# Patient Record
Sex: Female | Born: 1975
Health system: Southern US, Community
[De-identification: ages and names within clinical notes are randomized; demographics above are authoritative.]

## PROBLEM LIST (undated history)

## (undated) DIAGNOSIS — D500 Iron deficiency anemia secondary to blood loss (chronic): Secondary | ICD-10-CM

## (undated) DIAGNOSIS — T7840XA Allergy, unspecified, initial encounter: Secondary | ICD-10-CM

## (undated) DIAGNOSIS — D510 Vitamin B12 deficiency anemia due to intrinsic factor deficiency: Secondary | ICD-10-CM

## (undated) DIAGNOSIS — D250 Submucous leiomyoma of uterus: Secondary | ICD-10-CM

## (undated) DIAGNOSIS — N921 Excessive and frequent menstruation with irregular cycle: Secondary | ICD-10-CM

## (undated) HISTORY — DX: Vitamin B12 deficiency anemia due to intrinsic factor deficiency: D51.0

## (undated) HISTORY — PX: LAPAROSCOPIC DONOR NEPHRECTOMY: SUR763

## (undated) HISTORY — PX: ESOPHAGOGASTRODUODENOSCOPY (EGD) WITH PROPOFOL: SHX5813

## (undated) HISTORY — DX: Allergy, unspecified, initial encounter: T78.40XA

---

## 2001-10-01 ENCOUNTER — Other Ambulatory Visit: Admission: RE | Admit: 2001-10-01 | Discharge: 2001-10-01 | Payer: Self-pay | Admitting: Family Medicine

## 2009-05-13 HISTORY — PX: NEPHRECTOMY TRANSPLANTED ORGAN: SUR880

## 2009-05-25 ENCOUNTER — Encounter: Admission: RE | Admit: 2009-05-25 | Discharge: 2009-05-25 | Payer: Self-pay | Admitting: Family Medicine

## 2009-06-15 ENCOUNTER — Encounter: Admission: RE | Admit: 2009-06-15 | Discharge: 2009-06-15 | Payer: Self-pay | Admitting: Family Medicine

## 2010-04-12 DIAGNOSIS — IMO0002 Reserved for concepts with insufficient information to code with codable children: Secondary | ICD-10-CM

## 2010-04-12 HISTORY — DX: Reserved for concepts with insufficient information to code with codable children: IMO0002

## 2010-11-29 IMAGING — US US PELVIS COMPLETE
1 series · 13 of 25 positions shown · non-contrast
Comparison: None

CLINICAL DATA: Heavy menses with vaginal bleeding with pain and
clots for 3 months.  LMP 05/04/2009

TRANSABDOMINAL AND TRANSVAGINAL ULTRASOUND OF PELVIS
TECHNIQUE: Both transabdominal and transvaginal ultrasound
examinations of the pelvis were performed including evaluation of
the uterus, ovaries, adnexal regions, and pelvic cul-de-sac.

[Series 1: us pelvis complete · 0.26mm/px · 13 of 35 slices shown]
[im 1/35]
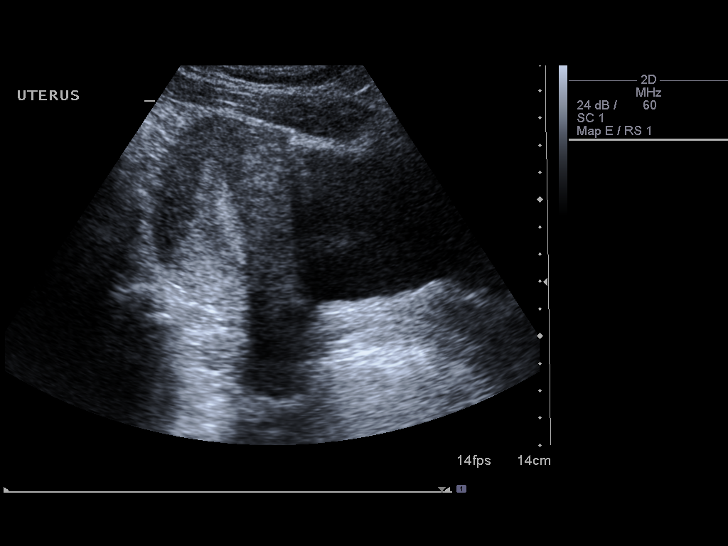
[im 3/35]
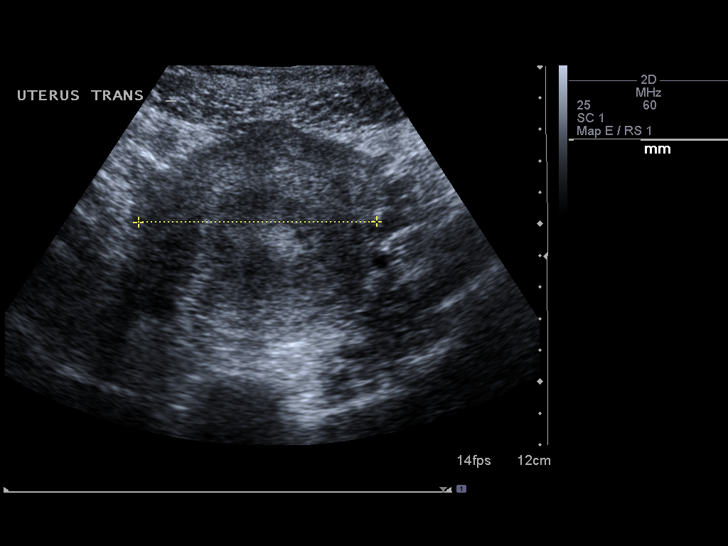
[im 6/35]
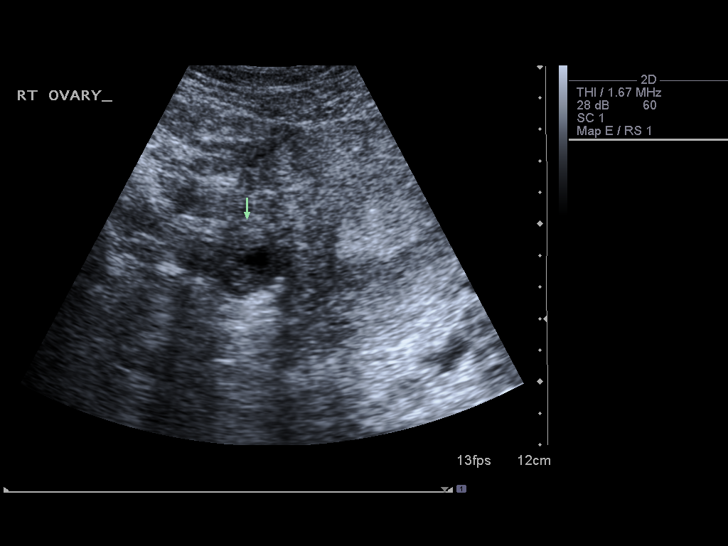
[im 9/35]
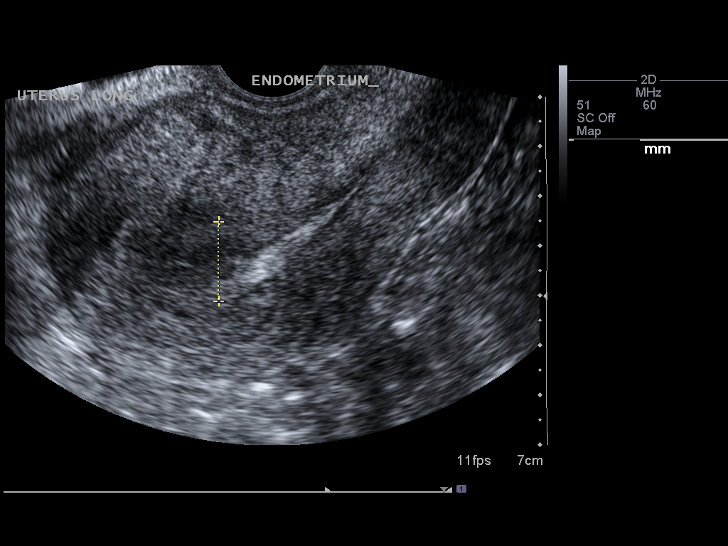
[im 12/35]
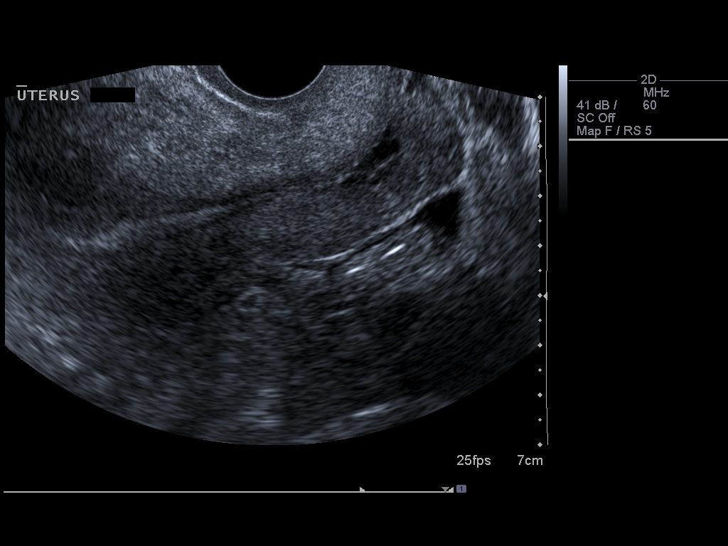
[im 15/35]
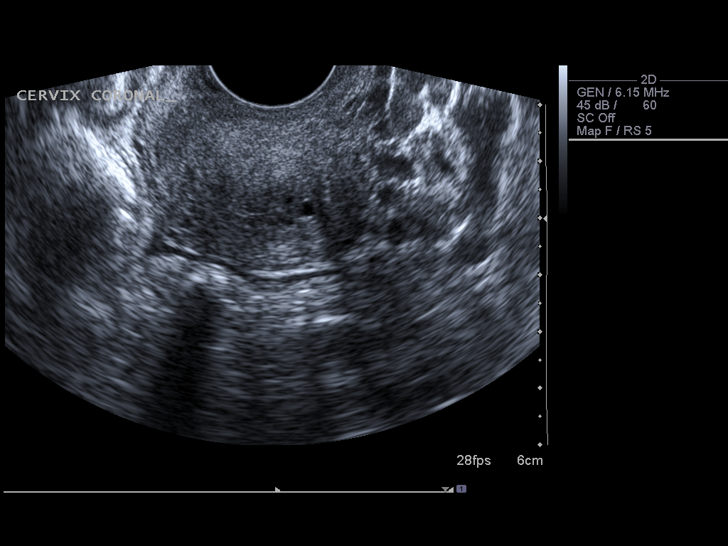
[im 18/35]
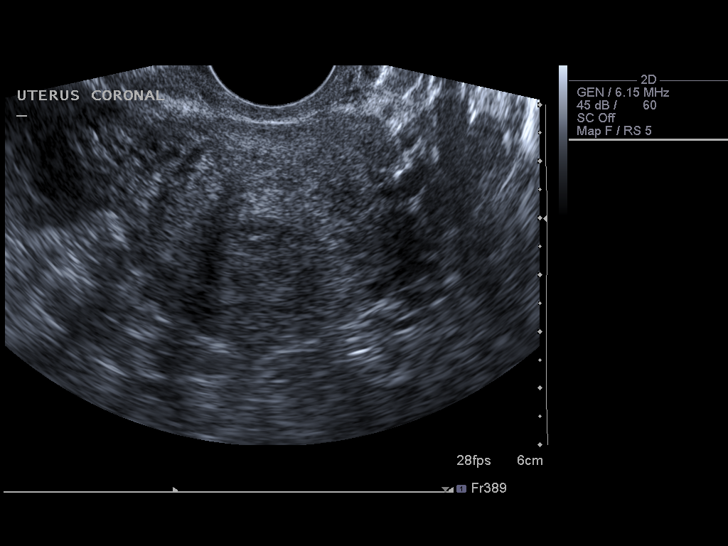
[im 20/35]
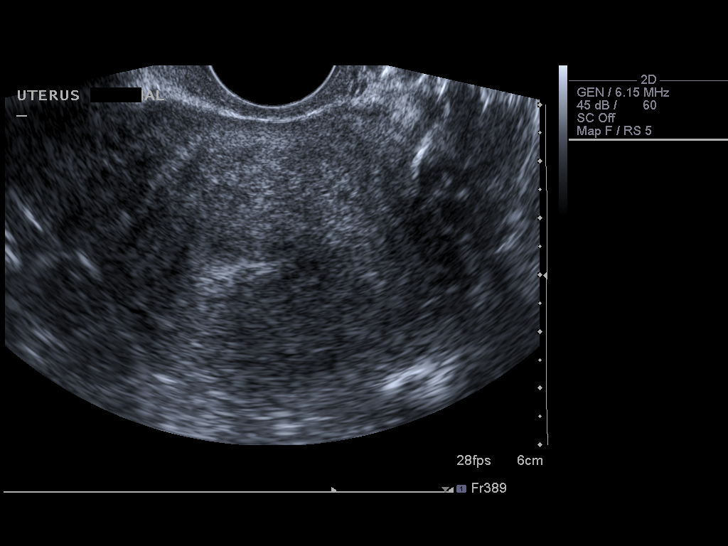
[im 23/35]
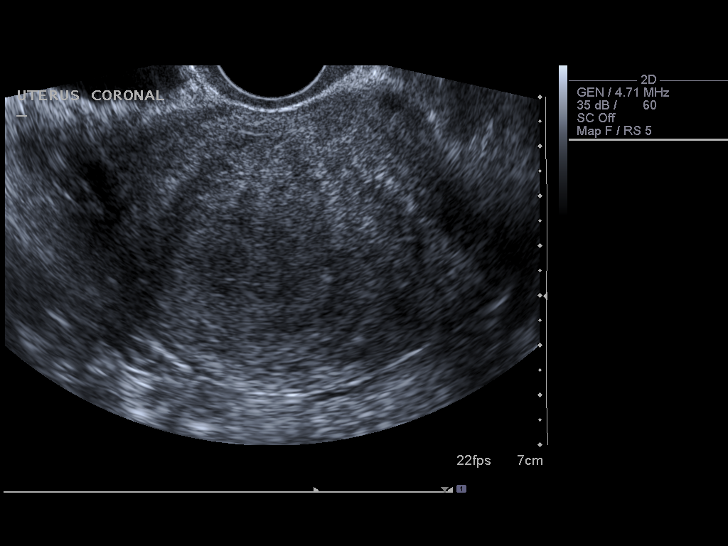
[im 26/35]
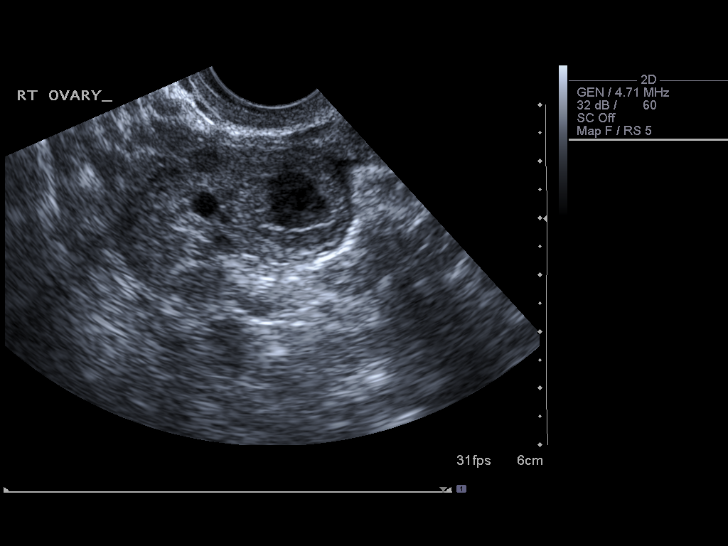
[im 29/35]
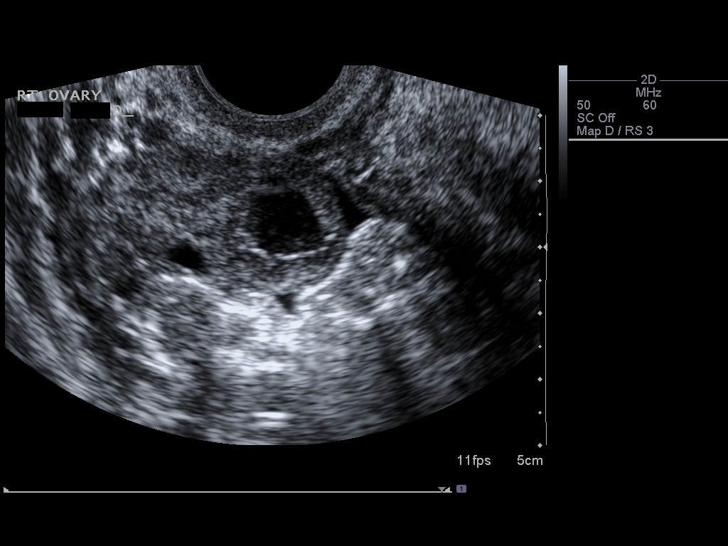
[im 32/35]
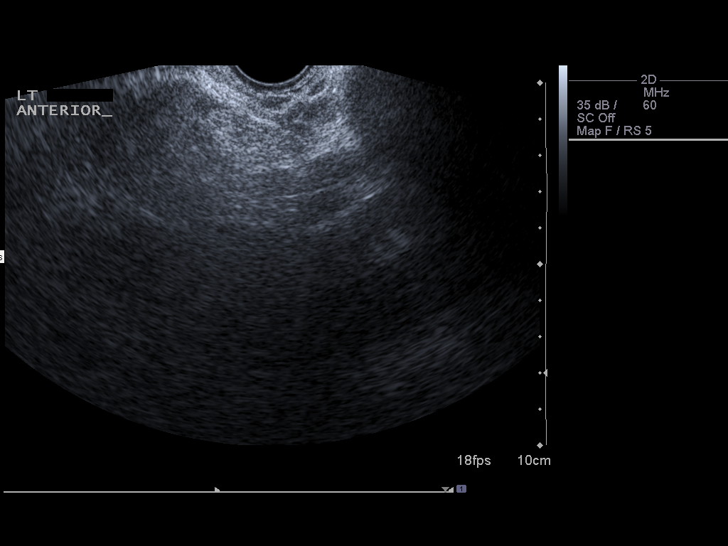
[im 35/35]
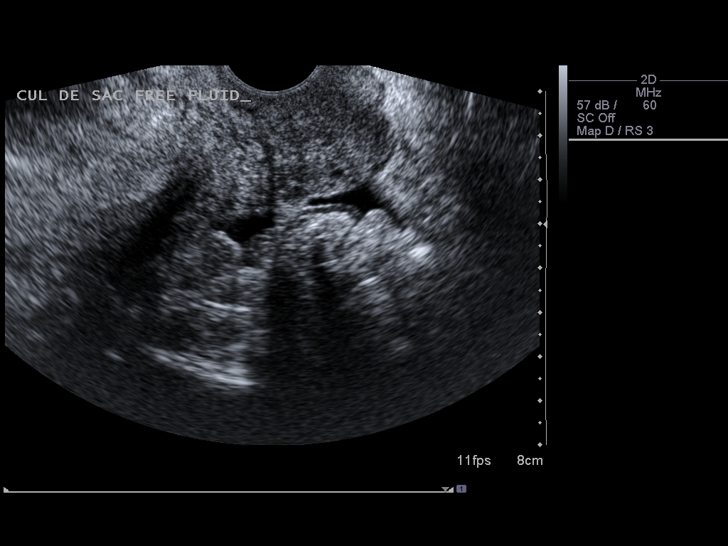

[13 of 25 positions shown; findings below may reference images not displayed]

FINDINGS: Uterus demonstrates a sagittal length of 9.3 cm, an AP width of
cm and a transverse width of 6.9 cm.  A homogeneous uterine
myometrium is seen.

Endometrium  is thickened with an AP width of 1.6 cm.  Some focal
echogenic material is identified within the canal in the lower
uterine segment suggesting some acute blood products.  No area of
focal thickening is seen.  Reevaluation in the immediate
postsecretory phase of the cycle would be useful for complete
assessment. If thickening persists in this phase of the cycle,
further assessment with sono hysterography may be useful.

Right Ovary has a normal appearance measuring 3.8 x 1.9 x 1.9 cm
and containing a corpus luteum

Left Ovary is not seen either transabdominally or endovaginally

Other Findings:  A small amount of simple free fluid is noted in
the cul-de-sac.  No separate adnexal masses are noted
IMPRESSION: Normal uterine myometrium and right ovary.  Non-visualized left
ovary.  Thickened endometrial lining with question of some acute
blood products in the lower uterine segment.  No definite focal
abnormality is seen but reassessment in the immediate postsecretory
phase of the cycle would be useful given the patient's history.
Please see above report for complete evaluation.

## 2010-12-20 IMAGING — US US PELVIS COMPLETE
1 series · 14 of 25 positions shown · non-contrast
Comparison: 05/25/2009

CLINICAL DATA: Abnormal uterine bleeding.  LMP 05/29/2009

TRANSABDOMINAL AND TRANSVAGINAL ULTRASOUND OF PELVIS
TECHNIQUE: Both transabdominal and transvaginal ultrasound
examinations of the pelvis were performed including evaluation of
the uterus, ovaries, adnexal regions, and pelvic cul-de-sac.

[Series 1: us pelvis complete · 0.24mm/px · 14 of 60 slices shown]
[im 1/60]
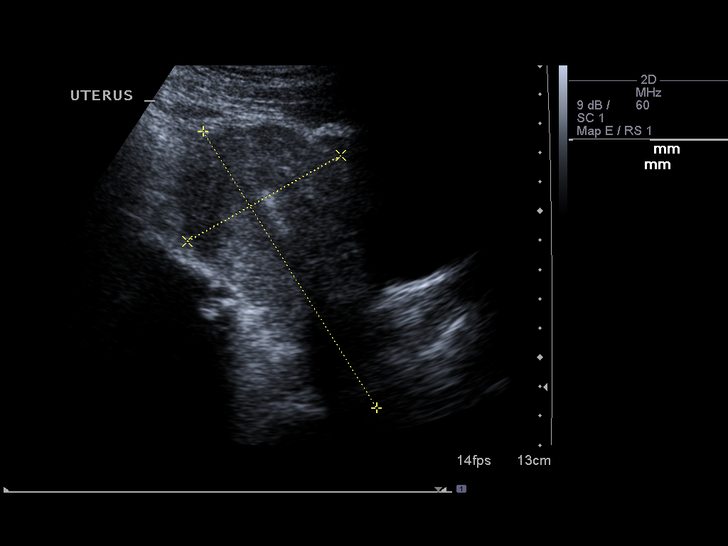
[im 5/60]
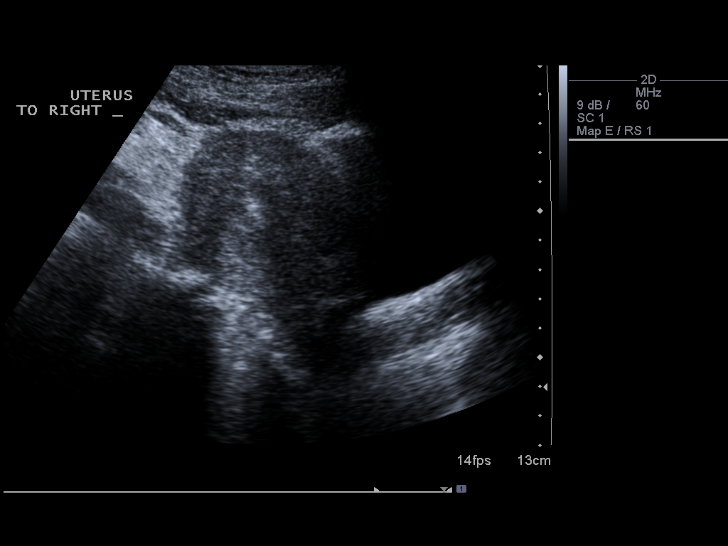
[im 10/60]
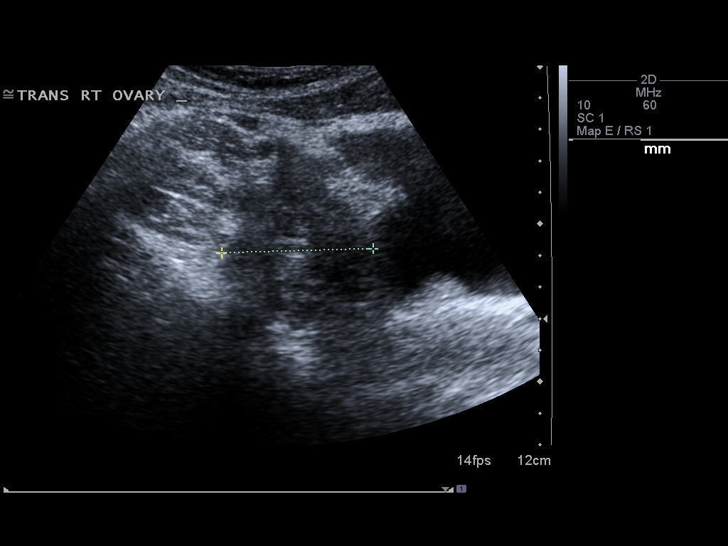
[im 15/60]
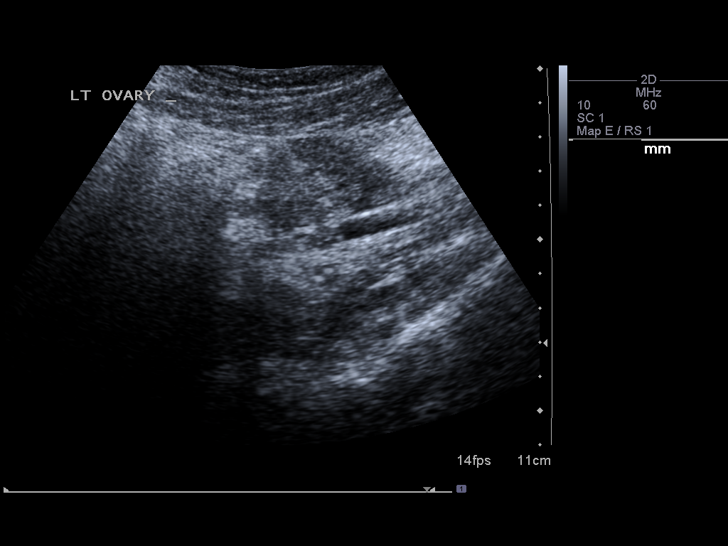
[im 20/60]
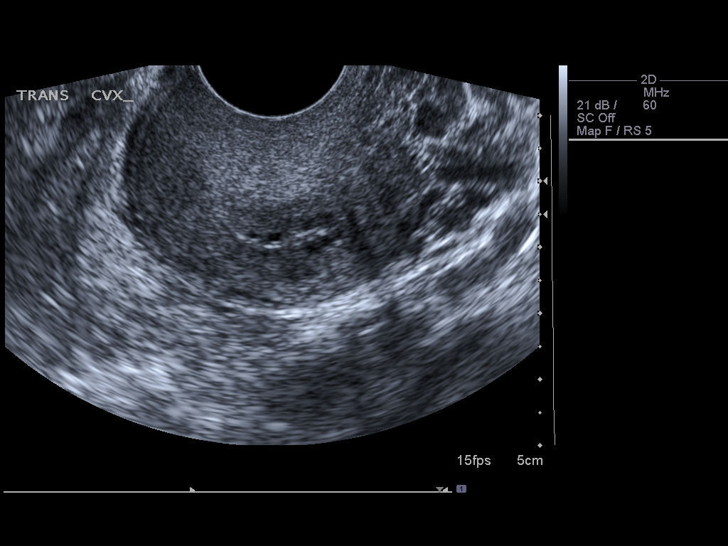
[im 23/60]
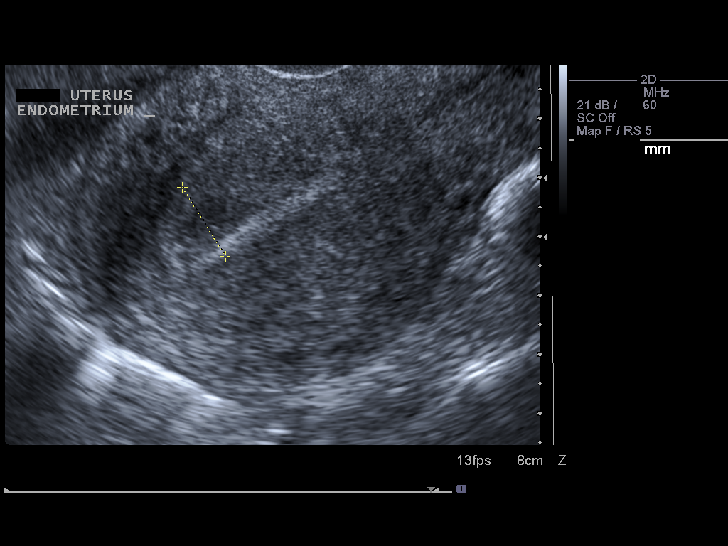
[im 28/60]
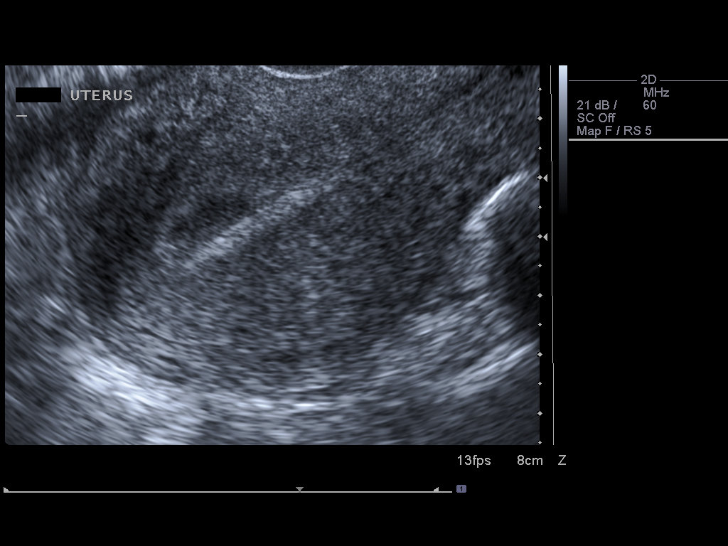
[im 32/60]
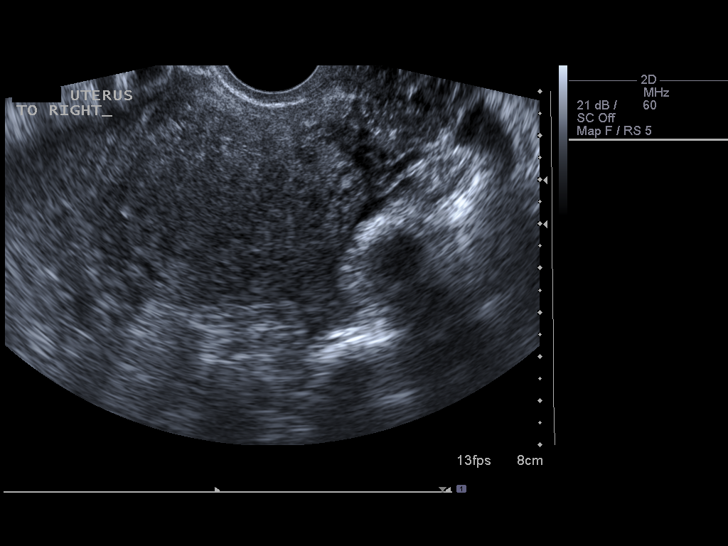
[im 37/60]
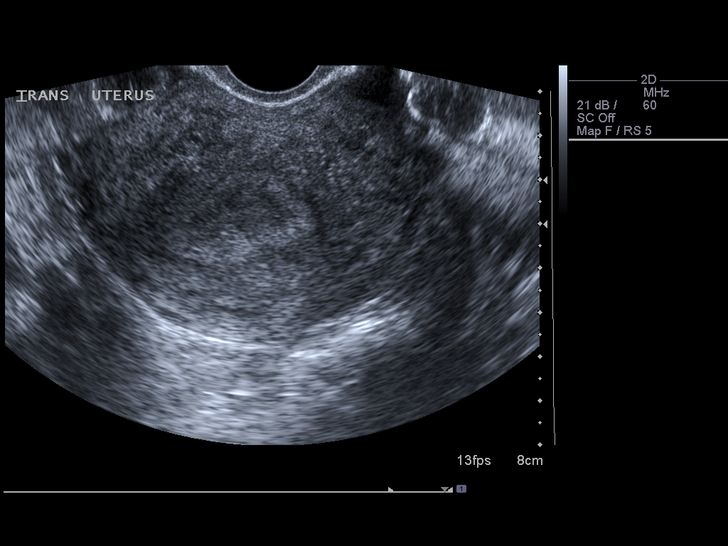
[im 40/60]
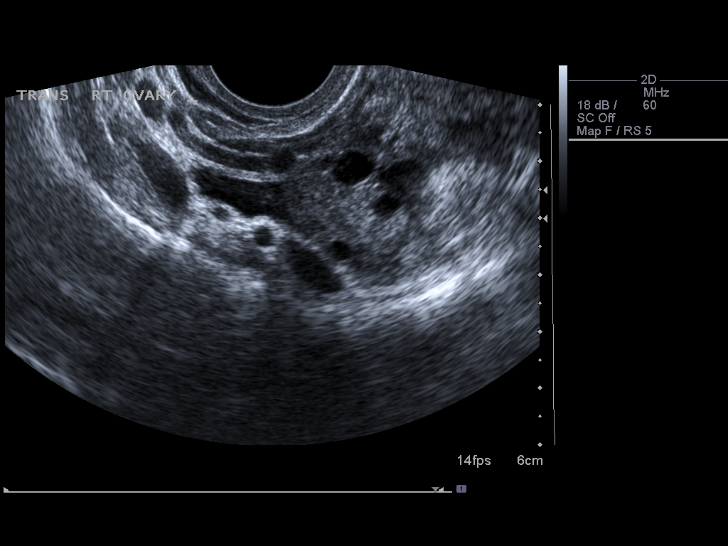
[im 45/60]
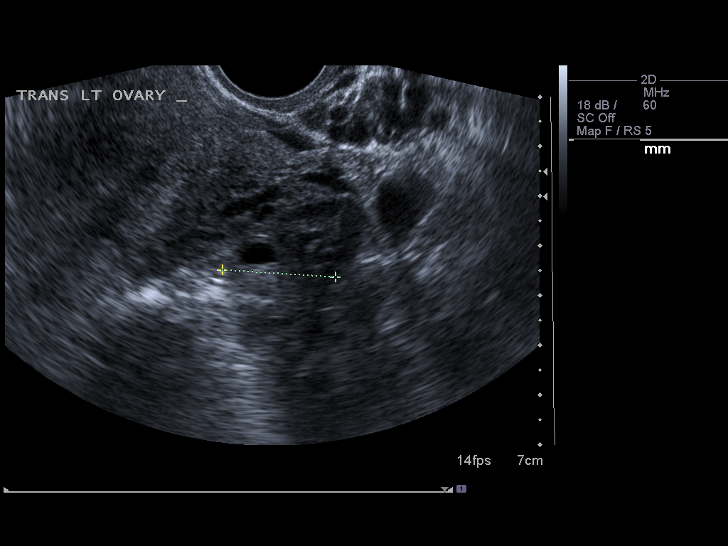
[im 50/60]
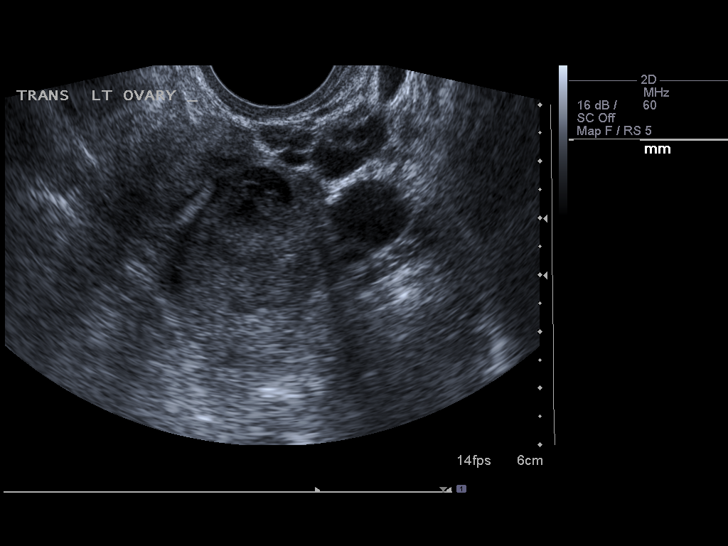
[im 55/60]
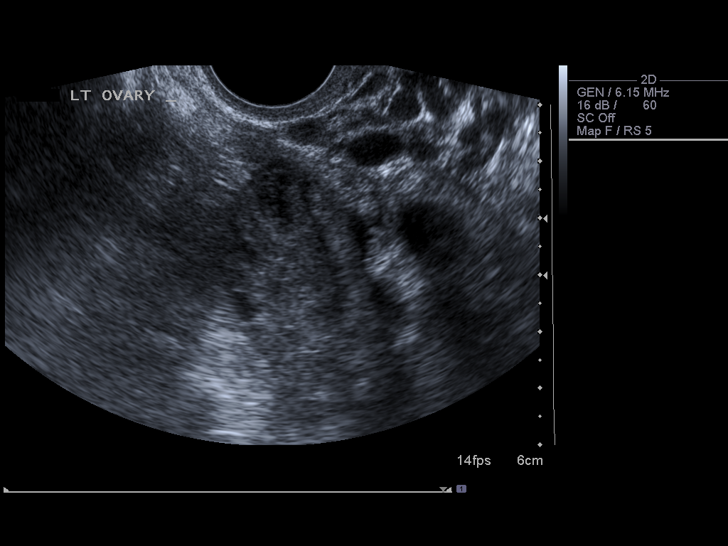
[im 60/60]
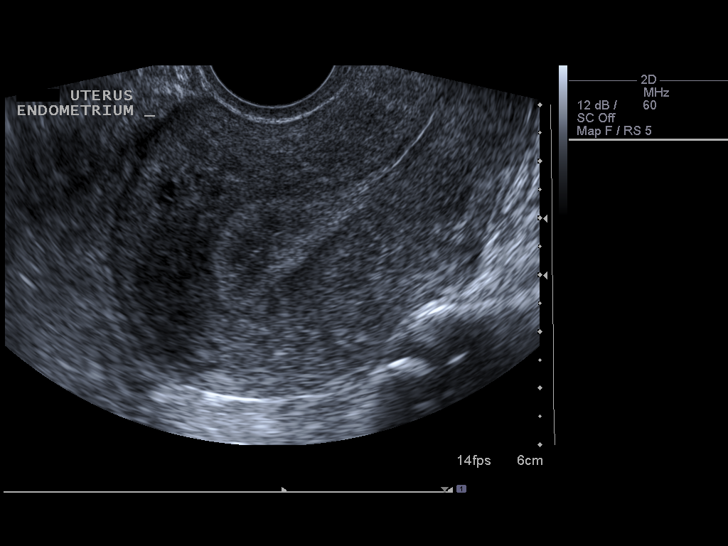

[14 of 25 positions shown; findings below may reference images not displayed]

FINDINGS: Uterus measures 11.2 by 6.0 x 7.9 cm.

Endometrium measures 15 mm in thickness.  There is a focal
hypoechoic lesion along the anterior endometrial-myometrial
junction which measures 2.5 x 2.2 x 1.0 cm.  Differential diagnosis
includes a submucosal fibroid, endometrial polyp, and adenomyosis.

Right Ovary measures 3.2 x 2.1 x 2.5 cm. Normal appearance.

Left Ovary measures 3.6 x 2.4 x 2.7 cm.  Normal appearance.

Other Findings:  Tiny amount of free fluid in right adnexa.
IMPRESSION: 1.  Focal 2.5 cm hypoechoic lesion along the anterior endometrial -
myometrial junction.  Differential diagnosis includes a submucosal
fibroid, endometrial polyp, and adenomyosis.  Sonohysterogram is
recommended for further evaluation.  This exam can be performed at
the [REDACTED].
2.  Normal ovaries.

## 2011-05-12 ENCOUNTER — Encounter (INDEPENDENT_AMBULATORY_CARE_PROVIDER_SITE_OTHER): Payer: BC Managed Care – PPO

## 2011-05-12 DIAGNOSIS — R5381 Other malaise: Secondary | ICD-10-CM

## 2011-05-12 DIAGNOSIS — Z139 Encounter for screening, unspecified: Secondary | ICD-10-CM

## 2011-05-12 DIAGNOSIS — Z01419 Encounter for gynecological examination (general) (routine) without abnormal findings: Secondary | ICD-10-CM

## 2011-05-12 DIAGNOSIS — Z23 Encounter for immunization: Secondary | ICD-10-CM

## 2011-05-12 DIAGNOSIS — Z0000 Encounter for general adult medical examination without abnormal findings: Secondary | ICD-10-CM

## 2011-07-09 ENCOUNTER — Ambulatory Visit (INDEPENDENT_AMBULATORY_CARE_PROVIDER_SITE_OTHER): Payer: BC Managed Care – PPO | Admitting: Physician Assistant

## 2011-07-09 VITALS — BP 100/64 | HR 94 | Temp 97.6°F | Resp 16 | Ht 66.0 in | Wt 158.0 lb

## 2011-07-09 DIAGNOSIS — Z905 Acquired absence of kidney: Secondary | ICD-10-CM

## 2011-07-09 DIAGNOSIS — R350 Frequency of micturition: Secondary | ICD-10-CM

## 2011-07-09 DIAGNOSIS — Q602 Renal agenesis, unspecified: Secondary | ICD-10-CM

## 2011-07-09 DIAGNOSIS — E860 Dehydration: Secondary | ICD-10-CM

## 2011-07-09 DIAGNOSIS — R899 Unspecified abnormal finding in specimens from other organs, systems and tissues: Secondary | ICD-10-CM

## 2011-07-09 LAB — POCT UA - MICROSCOPIC ONLY
Crystals, Ur, HPF, POC: NEGATIVE
WBC, Ur, HPF, POC: NEGATIVE

## 2011-07-09 LAB — POCT URINALYSIS DIPSTICK
Glucose, UA: NEGATIVE
Ketones, UA: NEGATIVE
Leukocytes, UA: NEGATIVE
Nitrite, UA: NEGATIVE
Protein, UA: NEGATIVE
Urobilinogen, UA: 0.2
pH, UA: 5.5

## 2011-07-09 LAB — BASIC METABOLIC PANEL: BUN: 12 mg/dL (ref 6–23)

## 2011-07-09 NOTE — Progress Notes (Signed)
  Subjective:    Patient ID: Rita Dean, female    DOB: 05-17-1975, 36 y.o.   MRN: [id number]  HPI Pt presents for lab recheck from 1/13 when she had an elevated BUN which ration suspected slight dehydration but pt donated a kidney 1/11 and we want to make sure kidney function has returned to her baseline.  She feels fine no problems.   Review of Systems     Objective:   Physical Exam  Constitutional: She is oriented to person, place, and time. She appears well-developed and well-nourished.  HENT:  Head: Normocephalic and atraumatic.  Right Ear: External ear normal.  Left Ear: External ear normal.  Eyes: Conjunctivae are normal.  Pulmonary/Chest: Effort normal.  Neurological: She is alert and oriented to person, place, and time.  Skin: Skin is warm and dry.  Psychiatric: She has a normal mood and affect. Her behavior is normal. Judgment and thought content normal.          Assessment & Plan:   1- single kidney with h/o abnl lab with elevated BUN.  Will check BMET today and fax to nephrologist on return.

## 2012-01-12 ENCOUNTER — Ambulatory Visit (INDEPENDENT_AMBULATORY_CARE_PROVIDER_SITE_OTHER): Payer: BC Managed Care – PPO | Admitting: Physician Assistant

## 2012-01-12 VITALS — BP 98/56 | HR 77 | Temp 98.6°F | Resp 18 | Ht 66.0 in | Wt 157.0 lb

## 2012-01-12 DIAGNOSIS — M62838 Other muscle spasm: Secondary | ICD-10-CM

## 2012-01-12 DIAGNOSIS — M549 Dorsalgia, unspecified: Secondary | ICD-10-CM

## 2012-01-12 NOTE — Progress Notes (Signed)
  Subjective:    Patient ID: Rita Dean, female    DOB: 1975/09/24, 36 y.o.   MRN: [id number]  HPI  Pt presents to clinic with 2d h/o back pain.  She was working out and during a move she felt a pain in her back on the L side that has continue to hurt her. She has no referred pain. She is worried because this is the side of her donated kidney and she wants to make sure she did not pull anything apart.  She has taken no medications because she tries to not use medications due to her single kidney status.  She has used no ice.  She has no bowel/bladder problems.  She has never hurt her back in the past.  Review of Systems  Musculoskeletal: Positive for back pain. Negative for joint swelling.  Neurological: Negative for numbness.       Objective:   Physical Exam  Vitals reviewed. Constitutional: She is oriented to person, place, and time. She appears well-developed and well-nourished.  HENT:  Head: Normocephalic and atraumatic.  Right Ear: External ear normal.  Left Ear: External ear normal.  Nose: Nose normal.  Eyes: Conjunctivae are normal.  Neck: Neck supple.  Pulmonary/Chest: Effort normal.  Musculoskeletal:       Lumbar back: She exhibits spasm (L paraspinal muscle). She exhibits normal range of motion, no tenderness, no bony tenderness and no pain.       Back:  Neurological: She is alert and oriented to person, place, and time. She has normal strength. No sensory deficit.  Reflex Scores:      Patellar reflexes are 2+ on the right side and 2+ on the left side.      Achilles reflexes are 2+ on the right side and 2+ on the left side. Skin: Skin is warm and dry.  Psychiatric: She has a normal mood and affect. Her behavior is normal. Judgment and thought content normal.          Assessment & Plan:   1. Back pain, acute   2. Spasm of muscle    This is musculoskeletal in origin. Offered muscle relaxers but patient declined due to not wanting to take meds.  PT instructed to  try heat and massage and stretching.  Answered patients questions.

## 2012-03-21 ENCOUNTER — Ambulatory Visit (INDEPENDENT_AMBULATORY_CARE_PROVIDER_SITE_OTHER): Payer: BC Managed Care – PPO | Admitting: Physician Assistant

## 2012-03-21 VITALS — BP 118/72 | HR 106 | Temp 97.9°F | Resp 18 | Ht 66.5 in | Wt 158.6 lb

## 2012-03-21 DIAGNOSIS — J029 Acute pharyngitis, unspecified: Secondary | ICD-10-CM

## 2012-03-21 LAB — POCT RAPID STREP A (OFFICE): Rapid Strep A Screen: NEGATIVE

## 2012-03-21 MED ORDER — FIRST-DUKES MOUTHWASH MT SUSP: 5.0000 mL | OROMUCOSAL | Status: DC | PRN

## 2012-03-21 MED ORDER — AMOXICILLIN 875 MG PO TABS: 875.0000 mg | ORAL_TABLET | Freq: Two times a day (BID) | ORAL | Status: DC

## 2012-03-21 NOTE — Progress Notes (Signed)
  Subjective:    Patient ID: Rita Dean, female    DOB: 29-Jan-1976, 36 y.o.   MRN: [id number]  HPI 36 year old female presents with 3 day history of sore throat. Admits that both of her children have been diagnosed with strep last week.  States symptoms started as a scratchy feeling at onset, but then has progressed to painful swallowing. Does have some congestion and rhinorrhea. Denies cough, otalgia, nausea, vomiting, fever, or chills.  No history of strep.  She has not taken any medications for this yet.      Review of Systems  Constitutional: Negative for fever and chills.  HENT: Positive for congestion, sore throat and rhinorrhea. Negative for ear pain.   Respiratory: Negative for cough, shortness of breath and wheezing.   Gastrointestinal: Negative for nausea, vomiting and abdominal pain.  Skin: Negative for rash.  Neurological: Positive for headaches.  All other systems reviewed and are negative.       Objective:   Physical Exam  Constitutional: She is oriented to person, place, and time. She appears well-developed and well-nourished.  HENT:  Head: Normocephalic and atraumatic.  Right Ear: Hearing, tympanic membrane, external ear and ear canal normal.  Left Ear: Hearing, tympanic membrane, external ear and ear canal normal.  Mouth/Throat: Uvula is midline and mucous membranes are normal. No oropharyngeal exudate (clear postnasal drainage. bilateral tonsillar erythema).  Eyes: Conjunctivae normal are normal.  Neck: Normal range of motion.  Cardiovascular: Normal rate, regular rhythm and normal heart sounds.   Pulmonary/Chest: Effort normal and breath sounds normal.  Musculoskeletal: Normal range of motion.  Lymphadenopathy:    She has no cervical adenopathy.  Neurological: She is alert and oriented to person, place, and time.  Psychiatric: She has a normal mood and affect. Her behavior is normal. Judgment and thought content normal.          Assessment & Plan:   1.  Acute pharyngitis  POCT rapid strep A, Culture, Group A Strep, DISCONTINUED: amoxicillin (AMOXIL) 875 MG tablet  Throat culture sent Will start amoxicillin 875 mg bid due to strep exposure [facility] prn pain Increase fluids Recommend Zyrtec daily to help with drainage Follow up if symptoms worsen or fail to improve

## 2012-03-23 LAB — CULTURE, GROUP A STREP: Organism ID, Bacteria: NORMAL

## 2012-04-20 DIAGNOSIS — Z005 Encounter for examination of potential donor of organ and tissue: Secondary | ICD-10-CM | POA: Insufficient documentation

## 2012-11-06 ENCOUNTER — Ambulatory Visit (INDEPENDENT_AMBULATORY_CARE_PROVIDER_SITE_OTHER): Payer: BC Managed Care – PPO | Admitting: Family Medicine

## 2012-11-06 VITALS — BP 107/70 | HR 70 | Temp 98.2°F | Resp 17 | Wt 147.0 lb

## 2012-11-06 DIAGNOSIS — D649 Anemia, unspecified: Secondary | ICD-10-CM

## 2012-11-06 DIAGNOSIS — R5383 Other fatigue: Secondary | ICD-10-CM

## 2012-11-06 DIAGNOSIS — R5381 Other malaise: Secondary | ICD-10-CM

## 2012-11-06 DIAGNOSIS — N209 Urinary calculus, unspecified: Secondary | ICD-10-CM

## 2012-11-06 LAB — POCT URINALYSIS DIPSTICK
Bilirubin, UA: NEGATIVE
Blood, UA: NEGATIVE
Glucose, UA: NEGATIVE
Ketones, UA: NEGATIVE
Leukocytes, UA: NEGATIVE
Nitrite, UA: NEGATIVE
Protein, UA: NEGATIVE
Spec Grav, UA: 1.01
Urobilinogen, UA: 0.2
pH, UA: 7

## 2012-11-06 LAB — POCT CBC
Granulocyte percent: 47.9 %G (ref 37–80)
HCT, POC: 34.8 % — AB (ref 37.7–47.9)
Hemoglobin: 10.4 g/dL — AB (ref 12.2–16.2)
Lymph, poc: 2.2 (ref 0.6–3.4)
MCH, POC: 23.1 pg — AB (ref 27–31.2)
MCHC: 29.9 g/dL — AB (ref 31.8–35.4)
MCV: 77.4 fL — AB (ref 80–97)
MID (cbc): 0.6 (ref 0–0.9)
MPV: 10.5 fL (ref 0–99.8)
POC Granulocyte: 2.6 (ref 2–6.9)
POC LYMPH PERCENT: 40.3 %L (ref 10–50)
POC MID %: 11.8 %M (ref 0–12)
Platelet Count, POC: 281 10*3/uL (ref 142–424)
RBC: 4.5 M/uL (ref 4.04–5.48)
RDW, POC: 17.4 %
WBC: 5.4 10*3/uL (ref 4.6–10.2)

## 2012-11-06 MED ORDER — POLYSACCHARIDE IRON COMPLEX 150 MG PO CAPS: 150.0000 mg | ORAL_CAPSULE | Freq: Two times a day (BID) | ORAL | Status: DC

## 2012-11-06 NOTE — Patient Instructions (Signed)

## 2012-11-06 NOTE — Progress Notes (Signed)
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 She's had a history of anemia and has had heavy periods as well. She is given a kidney to her mother who's had diabetes.  Patient has no fever, chills, night sweats, shortness of breath, cough, abdominal pain, dysuria  She works out 3 times a week at [employer] and has a Psychologist, educational there. Her strength is good but her cardio has not been improving.  Objective: No acute distress, thin athletic-appearing woman in no distress  Skin: Normal texture, no rash HEENT: Unremarkable Neck: Supple no adenopathy or thyromegaly Chest: Clear to auscultation Heart: Regular no murmur, no gallop or rub Abdomen: Soft nontender without HSM or masses. Extremities: Unremarkable  Results for orders placed in visit on 11/06/12  POCT URINALYSIS DIPSTICK      Result Value Range   Color, UA yellow     Clarity, UA clear     Glucose, UA neg     Bilirubin, UA neg     Ketones, UA neg     Spec Grav, UA 1.010     Blood, UA neg     pH, UA 7.0     Protein, UA neg     Urobilinogen, UA 0.2     Nitrite, UA neg     Leukocytes, UA Negative    POCT CBC      Result Value Range   WBC 5.4  4.6 - 10.2 K/uL   Lymph, poc 2.2  0.6 - 3.4   POC LYMPH PERCENT 40.3  10 - 50 %L   MID (cbc) 0.6  0 - 0.9   POC MID % 11.8  0 - 12 %M   POC Granulocyte 2.6  2 - 6.9   Granulocyte percent 47.9  37 - 80 %G   RBC 4.50  4.04 - 5.48 M/uL   Hemoglobin 10.4 (*) 12.2 - 16.2 g/dL   HCT, POC 04.5 (*) 40.9 - 47.9 %   MCV 77.4 (*) 80 - 97 fL   MCH, POC 23.1 (*) 27 - 31.2 pg   MCHC 29.9 (*) 31.8 - 35.4 g/dL   RDW, POC 81.1     Platelet Count, POC 281  142 - 424 K/uL   MPV 10.5  0 - 99.8 fL   Assessment:  Anemia  Other malaise and fatigue - Plan: POCT CBC, Comprehensive metabolic panel, Ferritin, TSH  Urinary calculi - Plan: POCT urinalysis dipstick, CANCELED: POCT UA - Microscopic Only  Anemia - Plan: iron polysaccharides (NIFEREX) 150 MG  capsule   Signed, Elvina Sidle, MD

## 2012-11-07 LAB — COMPREHENSIVE METABOLIC PANEL
ALT: 13 U/L (ref 0–35)
AST: 21 U/L (ref 0–37)
Albumin: 4.2 g/dL (ref 3.5–5.2)
Alkaline Phosphatase: 61 U/L (ref 39–117)
BUN: 9 mg/dL (ref 6–23)
CO2: 25 mEq/L (ref 19–32)
Calcium: 9.1 mg/dL (ref 8.4–10.5)
Chloride: 103 mEq/L (ref 96–112)
Creat: 1.31 mg/dL — ABNORMAL HIGH (ref 0.50–1.10)
Glucose, Bld: 81 mg/dL (ref 70–99)
Potassium: 4.6 mEq/L (ref 3.5–5.3)
Sodium: 135 mEq/L (ref 135–145)
Total Bilirubin: 0.7 mg/dL (ref 0.3–1.2)
Total Protein: 7.3 g/dL (ref 6.0–8.3)

## 2012-11-07 LAB — FERRITIN: Ferritin: 4 ng/mL — ABNORMAL LOW (ref 10–291)

## 2012-11-07 LAB — TSH: TSH: 1.485 u[IU]/mL (ref 0.350–4.500)

## 2013-11-12 ENCOUNTER — Ambulatory Visit (INDEPENDENT_AMBULATORY_CARE_PROVIDER_SITE_OTHER): Payer: BC Managed Care – PPO | Admitting: Family Medicine

## 2013-11-12 VITALS — BP 102/68 | HR 88 | Temp 98.0°F | Resp 18 | Ht 66.0 in | Wt 136.4 lb

## 2013-11-12 DIAGNOSIS — Z124 Encounter for screening for malignant neoplasm of cervix: Secondary | ICD-10-CM

## 2013-11-12 DIAGNOSIS — Z1329 Encounter for screening for other suspected endocrine disorder: Secondary | ICD-10-CM

## 2013-11-12 DIAGNOSIS — Z0000 Encounter for general adult medical examination without abnormal findings: Secondary | ICD-10-CM

## 2013-11-12 DIAGNOSIS — N920 Excessive and frequent menstruation with regular cycle: Secondary | ICD-10-CM

## 2013-11-12 DIAGNOSIS — Z13220 Encounter for screening for lipoid disorders: Secondary | ICD-10-CM

## 2013-11-12 LAB — LIPID PANEL
CHOLESTEROL: 174 mg/dL (ref 0–200)
HDL: 77 mg/dL (ref >39)
LDL Cholesterol: 89 mg/dL (ref 0–99)
TRIGLYCERIDES: 38 mg/dL (ref <150)
Total CHOL/HDL Ratio: 2.3 Ratio
VLDL: 8 mg/dL (ref 0–40)

## 2013-11-12 LAB — CBC
HCT: 31 % — ABNORMAL LOW (ref 36.0–46.0)
Hemoglobin: 10.4 g/dL — ABNORMAL LOW (ref 12.0–15.0)
MCH: 22.7 pg — AB (ref 26.0–34.0)
MCHC: 33.5 g/dL (ref 30.0–36.0)
MCV: 67.7 fL — ABNORMAL LOW (ref 78.0–100.0)
PLATELETS: 364 10*3/uL (ref 150–400)
RBC: 4.58 MIL/uL (ref 3.87–5.11)
RDW: 15.4 % (ref 11.5–15.5)
WBC: 4.4 10*3/uL (ref 4.0–10.5)

## 2013-11-12 LAB — COMPREHENSIVE METABOLIC PANEL
ALBUMIN: 4.5 g/dL (ref 3.5–5.2)
ALK PHOS: 55 U/L (ref 39–117)
ALT: 12 U/L (ref 0–35)
AST: 21 U/L (ref 0–37)
BILIRUBIN TOTAL: 1.3 mg/dL — AB (ref 0.2–1.2)
BUN: 11 mg/dL (ref 6–23)
CALCIUM: 9.7 mg/dL (ref 8.4–10.5)
CO2: 25 mEq/L (ref 19–32)
Chloride: 102 mEq/L (ref 96–112)
Creat: 1.23 mg/dL — ABNORMAL HIGH (ref 0.50–1.10)
GLUCOSE: 80 mg/dL (ref 70–99)
Potassium: 4.3 mEq/L (ref 3.5–5.3)
Sodium: 138 mEq/L (ref 135–145)
TOTAL PROTEIN: 7.4 g/dL (ref 6.0–8.3)

## 2013-11-12 LAB — FERRITIN: Ferritin: 4 ng/mL — ABNORMAL LOW (ref 10–291)

## 2013-11-12 LAB — TSH: TSH: 0.967 u[IU]/mL (ref 0.350–4.500)

## 2013-11-12 NOTE — Progress Notes (Signed)
[facility] [address] [place] [address] [phone]  Date:  11/12/2013   Name:  Rita Dean   DOB:  03/31/1976   MRN:  [id number]  PCP:  No PCP Per Patient    Chief Complaint: Annual Exam   History of Present Illness:  Rita Dean is a 38 y.o. very pleasant female patient who presents with the following:  She would like a CPE and pap today.   She is fasting today for labs.   She has been feeling tired- would like to check her iron.  She has noted heavy menses most of her life.  Periods seem to be getting heavier.  She does sometimes bleed through her cglothes.  Back in 2011 we did an ultrasound that showed a small uncertain uterine finding. She was seen by OBG and released from care.  She would like to do something about her menorrhagia but is not interested in surgery or OCP  Patient Active Problem List   Diagnosis Date Noted  . Single kidney 07/09/2011    Past Medical History  Diagnosis Date  . Allergy   . Anemia     Past Surgical History  Procedure Laterality Date  . Nephrectomy transplanted organ  2011    History  Substance Use Topics  . Smoking status: Never Smoker   . Smokeless tobacco: Not on file  . Alcohol Use: No    Family History  Problem Relation Age of Onset  . Diabetes Mother   . Hypertension Mother   . Seizures Sister   . Asthma Son   . Diabetes Maternal Grandmother   . Stroke Maternal Grandmother     Allergies  Allergen Reactions  . Shellfish Allergy Rash    Medication list has been reviewed and updated.  Current Outpatient Prescriptions on File Prior to Visit  Medication Sig Dispense Refill  . iron polysaccharides (NIFEREX) 150 MG capsule Take 1 capsule (150 mg total) by mouth 2 (two) times daily.  30 capsule  6   No current facility-administered medications on file prior to visit.    Review of Systems:  As per HPI- otherwise negative.   Physical Examination: Filed Vitals:   11/12/13 1335  BP:  102/68  Pulse: 88  Temp: 98 F (36.7 C)  Resp: 18   Filed Vitals:   11/12/13 1335  Height: 5\' 6"  (1.676 m)  Weight: 136 lb 6.4 oz (61.871 kg)   Body mass index is 22.03 kg/(m^2). Ideal Body Weight: Weight in (lb) to have BMI = 25: 154.6  GEN: WDWN, NAD, Non-toxic, A & O x 3, slim build, looks well HEENT: Atraumatic, Normocephalic. Neck supple. No masses, No LAD.  Bilateral TM wnl, oropharynx normal.  PEERL,EOMI.   Ears and Nose: No external deformity. CV: RRR, No M/G/R. No JVD. No thrill. No extra heart sounds. PULM: CTA B, no wheezes, crackles, rhonchi. No retractions. No resp. distress. No accessory muscle use. ABD: S, NT, ND, +BS. No rebound. No HSM. EXTR: No c/c/e NEURO Normal gait.  PSYCH: Normally interactive. Conversant. Not depressed or anxious appearing.  Calm demeanor.  Breast: normal exam, no masses/ dimpling/ discharge Pelvic: normal, no vaginal lesions or discharge. Uterus normal, no CMT, no adnexal tendereness or masses   Assessment and Plan: Physical exam - Plan: CBC, Comprehensive metabolic panel  Screening for cervical cancer - Plan: Pap IG w/ reflex to HPV when ASC-U  Screening for hyperlipidemia - Plan: Lipid panel  Menorrhagia with regular cycle - Plan:  Ferritin  Screening for hypothyroidism - Plan: TSH  Pap and labs today.  She is UTD on her immunizations.   Asked her to please get in touch with her OBG and have a re-evaluation.  She agrees to do so Will plan further follow- up pending labs.   Signed Lamar Blinks, MD

## 2013-11-12 NOTE — Patient Instructions (Signed)
I will be in touch with your labs.  I do suspect that your heavy menses are contributing to your anemia.  Please do give Dr. Julien Girt' office a call and schedule a consultation with her.  There is probably something that can be done to ease your bleeding.    [facility]   Address: [address], [place], [place] [address]  [phone]

## 2013-11-15 ENCOUNTER — Telehealth: Payer: Self-pay

## 2013-11-15 DIAGNOSIS — D509 Iron deficiency anemia, unspecified: Secondary | ICD-10-CM

## 2013-11-15 NOTE — Telephone Encounter (Signed)
Patient called requesting the status of a medication that was suppose to be called in for her for anemia. Per patient she spoke with Dr. Lorelei Pont this past Saturday. Patient requesting it to be sent to [facility] on [facility] [place] and patients call back number is [phone]

## 2013-11-15 NOTE — Telephone Encounter (Signed)
I don't see where you were going to call in something for her.

## 2013-11-16 MED ORDER — FERROUS SULFATE 325 (65 FE) MG PO TBEC: 325.0000 mg | DELAYED_RELEASE_TABLET | Freq: Two times a day (BID) | ORAL | Status: DC

## 2013-11-16 NOTE — Telephone Encounter (Signed)
Called in some iron-sorry I thought she had some at home. Have sent to her drug store

## 2013-11-17 LAB — PAP IG W/ RFLX HPV ASCU

## 2013-11-18 ENCOUNTER — Encounter: Payer: Self-pay | Admitting: Family Medicine

## 2013-11-18 LAB — HUMAN PAPILLOMAVIRUS, HIGH RISK: HPV DNA High Risk: NOT DETECTED

## 2014-05-08 ENCOUNTER — Ambulatory Visit (INDEPENDENT_AMBULATORY_CARE_PROVIDER_SITE_OTHER): Payer: 59 | Admitting: Physician Assistant

## 2014-05-08 VITALS — BP 112/78 | HR 82 | Temp 98.4°F | Resp 16 | Ht 66.0 in | Wt 141.6 lb

## 2014-05-08 DIAGNOSIS — D509 Iron deficiency anemia, unspecified: Secondary | ICD-10-CM

## 2014-05-08 DIAGNOSIS — J029 Acute pharyngitis, unspecified: Secondary | ICD-10-CM

## 2014-05-08 DIAGNOSIS — R5383 Other fatigue: Secondary | ICD-10-CM

## 2014-05-08 LAB — POCT RAPID STREP A (OFFICE): Rapid Strep A Screen: NEGATIVE

## 2014-05-08 LAB — POCT CBC
Granulocyte percent: 64.5 %G (ref 37–80)
HCT, POC: 29.3 % — AB (ref 37.7–47.9)
Hemoglobin: 9.2 g/dL — AB (ref 12.2–16.2)
Lymph, poc: 2.1 (ref 0.6–3.4)
MCH: 24.1 pg — AB (ref 27–31.2)
MCHC: 31.5 g/dL — AB (ref 31.8–35.4)
MCV: 76.4 fL — AB (ref 80–97)
MID (CBC): 0.5 (ref 0–0.9)
MPV: 7.8 fL (ref 0–99.8)
POC Granulocyte: 4.8 (ref 2–6.9)
POC LYMPH %: 28.8 % (ref 10–50)
POC MID %: 6.7 % (ref 0–12)
Platelet Count, POC: 308 10*3/uL (ref 142–424)
RBC: 3.84 M/uL — AB (ref 4.04–5.48)
RDW, POC: 14.6 %
WBC: 7.4 10*3/uL (ref 4.6–10.2)

## 2014-05-08 MED ORDER — GUAIFENESIN ER 1200 MG PO TB12: 1.0000 | ORAL_TABLET | Freq: Two times a day (BID) | ORAL | Status: DC | PRN

## 2014-05-08 MED ORDER — AMOXICILLIN 875 MG PO TABS: 875.0000 mg | ORAL_TABLET | Freq: Two times a day (BID) | ORAL | Status: AC

## 2014-05-08 MED ORDER — BENZONATATE 100 MG PO CAPS: 100.0000 mg | ORAL_CAPSULE | Freq: Three times a day (TID) | ORAL | Status: DC | PRN

## 2014-05-08 MED ORDER — IPRATROPIUM BROMIDE 0.03 % NA SOLN: 2.0000 | Freq: Two times a day (BID) | NASAL | Status: DC

## 2014-05-08 NOTE — Progress Notes (Signed)
Subjective:    Dean ID: Rita Dean, female    DOB: [birth year], 38 y.o.   MRN: [id number]   PCP: Rita Dean (usually comes to this clinic, Windell Hummingbird, PA-C)  Chief Complaint  Dean presents with  . Cough    productive , symptoms x 1 week  . congestion    head and chest   . Extremity Weakness  . Sore Throat  . Chills    Allergies  Allergen Reactions  . Shellfish Allergy Rash    Dean Active Problem List   Diagnosis Date Noted  . Single kidney 07/09/2011    Prior to Admission medications   Medication Sig Start Date End Date Taking? Authorizing Provider  ferrous sulfate 325 (65 FE) MG EC tablet Take 1 tablet (325 mg total) by mouth 2 (two) times daily. 11/16/13  Yes Darreld Mclean, MD    Medical, Surgical, Family and Social History reviewed and updated.  HPI Presents with productive cough x 1 week. Sore throat. 2 kids at home with strep throat. "Really tired and weak. It may have to do with my iron." She has known iron deficiency anemia. Headache. Rita visual changes, dizziness. Feels chest congestion, drainage in the throat, sinus pressure, nasal congestion. Rita fever, but alternately hot and cold. Rita nausea/vomiting/diarrhea.  Review of Systems As above.    Objective:   Physical Exam  Constitutional: She is oriented to person, place, and time. Vital signs are normal. She appears well-developed and well-nourished. She is active and cooperative. Rita distress.  BP 112/78 mmHg  Pulse 82  Temp(Src) 98.4 F (36.9 C) (Oral)  Resp 16  Ht 5\' 6"  (1.676 m)  Wt 141 lb 9.6 oz (64.229 kg)  BMI 22.87 kg/m2  SpO2 100%  LMP 05/02/2014   HENT:  Head: Normocephalic and atraumatic.  Right Ear: Hearing, tympanic membrane, external ear and ear canal normal.  Left Ear: Hearing, tympanic membrane, external ear and ear canal normal.  Nose: Mucosal edema and rhinorrhea present.  Rita foreign bodies. Right sinus exhibits Rita maxillary sinus tenderness and Rita frontal  sinus tenderness. Left sinus exhibits Rita maxillary sinus tenderness and Rita frontal sinus tenderness.  Mouth/Throat: Uvula is midline, oropharynx is clear and moist and mucous membranes are normal. Rita uvula swelling. Rita oropharyngeal exudate.  Eyes: Conjunctivae and EOM are normal. Pupils are equal, round, and reactive to light. Right eye exhibits Rita discharge. Left eye exhibits Rita discharge. Rita scleral icterus.  Neck: Trachea normal, normal range of motion and full passive range of motion without pain. Neck supple. Rita thyroid mass and Rita thyromegaly present.  Cardiovascular: Normal rate, regular rhythm and normal heart sounds.   Pulmonary/Chest: Effort normal and breath sounds normal.  Lymphadenopathy:       Head (right side): Rita submandibular, Rita tonsillar, Rita preauricular, Rita posterior auricular and Rita occipital adenopathy present.       Head (left side): Rita submandibular, Rita tonsillar, Rita preauricular and Rita occipital adenopathy present.    She has Rita cervical adenopathy.       Right: Rita supraclavicular adenopathy present.       Left: Rita supraclavicular adenopathy present.  Neurological: She is alert and oriented to person, place, and time. She has normal strength. Rita cranial nerve deficit or sensory deficit.  Skin: Skin is warm, dry and intact. Rita rash noted.  Psychiatric: She has a normal mood and affect. Her speech is normal and behavior is normal.      Results for orders  placed or performed in visit on 05/08/14  POCT rapid strep A  Result Value Ref Range   Rapid Strep A Screen Negative Negative  POCT CBC  Result Value Ref Range   WBC 7.4 4.6 - 10.2 K/uL   Lymph, poc 2.1 0.6 - 3.4   POC LYMPH PERCENT 28.8 10 - 50 %L   MID (cbc) 0.5 0 - 0.9   POC MID % 6.7 0 - 12 %M   POC Granulocyte 4.8 2 - 6.9   Granulocyte percent 64.5 37 - 80 %G   RBC 3.84 (A) 4.04 - 5.48 M/uL   Hemoglobin 9.2 (A) 12.2 - 16.2 g/dL   HCT, POC 29.3 (A) 37.7 - 47.9 %   MCV 76.4 (A) 80 - 97 fL   MCH, POC 24.1  (A) 27 - 31.2 pg   MCHC 31.5 (A) 31.8 - 35.4 g/dL   RDW, POC 14.6 %   Platelet Count, POC 308 142 - 424 K/uL   MPV 7.8 0 - 99.8 fL       Assessment & Plan:  1. Sore throat Suspect viral URI, but given two known contacts with strep throat, elect to cover for that infection. Supportive care.   - POCT rapid strep A - Culture, Group A Strep - amoxicillin (AMOXIL) 875 MG tablet; Take 1 tablet (875 mg total) by mouth 2 (two) times daily.  Dispense: 20 tablet; Refill: 0 - benzonatate (TESSALON) 100 MG capsule; Take 1-2 capsules (100-200 mg total) by mouth 3 (three) times daily as needed for cough.  Dispense: 40 capsule; Refill: 0 - ipratropium (ATROVENT) 0.03 % nasal spray; Place 2 sprays into both nostrils 2 (two) times daily.  Dispense: 30 mL; Refill: 0 - Guaifenesin (MUCINEX MAXIMUM STRENGTH) 1200 MG TB12; Take 1 tablet (1,200 mg total) by mouth every 12 (twelve) hours as needed.  Dispense: 14 tablet; Refill: 1  2. Other fatigue As above. - POCT CBC - Culture, Group A Strep  3. Anemia, iron deficiency Hgb lower than last check, most likely due to recent menses. Continue supplementation and increase iron rich sources in her diet. However, if fatigue persists once she is well from the current illness, recheck CBC.   Fara Chute, PA-C Physician Assistant-Certified [facility]

## 2014-05-08 NOTE — Patient Instructions (Addendum)
Since your hemoglobin has dropped since last check, I recommend that you make sure your diet includes iron rich foods in addition to the iron supplement that you take twice daily.  Get plenty of rest and drink at least 64 ounces of water daily.

## 2014-05-10 LAB — CULTURE, GROUP A STREP: ORGANISM ID, BACTERIA: NORMAL

## 2015-04-01 ENCOUNTER — Ambulatory Visit (INDEPENDENT_AMBULATORY_CARE_PROVIDER_SITE_OTHER): Payer: 59 | Admitting: Physician Assistant

## 2015-04-01 VITALS — BP 114/80 | HR 84 | Temp 98.8°F | Resp 18 | Ht 67.25 in | Wt 144.4 lb

## 2015-04-01 DIAGNOSIS — Z114 Encounter for screening for human immunodeficiency virus [HIV]: Secondary | ICD-10-CM

## 2015-04-01 DIAGNOSIS — Z113 Encounter for screening for infections with a predominantly sexual mode of transmission: Secondary | ICD-10-CM

## 2015-04-01 DIAGNOSIS — Z524 Kidney donor: Secondary | ICD-10-CM | POA: Diagnosis not present

## 2015-04-01 DIAGNOSIS — Z01419 Encounter for gynecological examination (general) (routine) without abnormal findings: Secondary | ICD-10-CM | POA: Diagnosis not present

## 2015-04-01 DIAGNOSIS — Z13220 Encounter for screening for lipoid disorders: Secondary | ICD-10-CM | POA: Diagnosis not present

## 2015-04-01 DIAGNOSIS — N898 Other specified noninflammatory disorders of vagina: Secondary | ICD-10-CM

## 2015-04-01 DIAGNOSIS — Z0000 Encounter for general adult medical examination without abnormal findings: Secondary | ICD-10-CM

## 2015-04-01 DIAGNOSIS — D509 Iron deficiency anemia, unspecified: Secondary | ICD-10-CM | POA: Diagnosis not present

## 2015-04-01 DIAGNOSIS — Z8742 Personal history of other diseases of the female genital tract: Secondary | ICD-10-CM | POA: Diagnosis not present

## 2015-04-01 DIAGNOSIS — Z13228 Encounter for screening for other metabolic disorders: Secondary | ICD-10-CM | POA: Diagnosis not present

## 2015-04-01 DIAGNOSIS — R5383 Other fatigue: Secondary | ICD-10-CM | POA: Diagnosis not present

## 2015-04-01 LAB — CBC WITH DIFFERENTIAL/PLATELET
BASOS ABS: 0 10*3/uL (ref 0.0–0.1)
Basophils Relative: 1 % (ref 0–1)
EOS ABS: 0.4 10*3/uL (ref 0.0–0.7)
EOS PCT: 9 % — AB (ref 0–5)
HEMATOCRIT: 28.2 % — AB (ref 36.0–46.0)
Hemoglobin: 8.8 g/dL — ABNORMAL LOW (ref 12.0–15.0)
LYMPHS ABS: 1.6 10*3/uL (ref 0.7–4.0)
Lymphocytes Relative: 37 % (ref 12–46)
MCH: 21.8 pg — AB (ref 26.0–34.0)
MCHC: 31.2 g/dL (ref 30.0–36.0)
MCV: 70 fL — AB (ref 78.0–100.0)
MONO ABS: 0.4 10*3/uL (ref 0.1–1.0)
MPV: 10.1 fL (ref 8.6–12.4)
Monocytes Relative: 8 % (ref 3–12)
NEUTROS ABS: 2 10*3/uL (ref 1.7–7.7)
NEUTROS PCT: 45 % (ref 43–77)
PLATELETS: 317 10*3/uL (ref 150–400)
RBC: 4.03 MIL/uL (ref 3.87–5.11)
RDW: 15.4 % (ref 11.5–15.5)
WBC: 4.4 10*3/uL (ref 4.0–10.5)

## 2015-04-01 LAB — POCT WET + KOH PREP
TRICH BY WET PREP: ABSENT
Yeast by KOH: ABSENT
Yeast by wet prep: ABSENT

## 2015-04-01 LAB — TSH: TSH: 1.107 u[IU]/mL (ref 0.350–4.500)

## 2015-04-01 LAB — HIV ANTIBODY (ROUTINE TESTING W REFLEX): HIV: NONREACTIVE

## 2015-04-01 NOTE — Patient Instructions (Signed)
Health Maintenance, Female Adopting a healthy lifestyle and getting preventive care can go a long way to promote health and wellness. Talk with your health care provider about what schedule of regular examinations is right for you. This is a good chance for you to check in with your provider about disease prevention and staying healthy. In between checkups, there are plenty of things you can do on your own. Experts have done a lot of research about which lifestyle changes and preventive measures are most likely to keep you healthy. Ask your health care provider for more information. WEIGHT AND DIET  Eat a healthy diet  Be sure to include plenty of vegetables, fruits, low-fat dairy products, and lean protein.  Do not eat a lot of foods high in solid fats, added sugars, or salt.  Get regular exercise. This is one of the most important things you can do for your health.  Most adults should exercise for at least 150 minutes each week. The exercise should increase your heart rate and make you sweat (moderate-intensity exercise).  Most adults should also do strengthening exercises at least twice a week. This is in addition to the moderate-intensity exercise.  Maintain a healthy weight  Body mass index (BMI) is a measurement that can be used to identify possible weight problems. It estimates body fat based on height and weight. Your health care provider can help determine your BMI and help you achieve or maintain a healthy weight.  For females 20 years of age and older:   A BMI below 18.5 is considered underweight.  A BMI of 18.5 to 24.9 is normal.  A BMI of 25 to 29.9 is considered overweight.  A BMI of 30 and above is considered obese.  Watch levels of cholesterol and blood lipids  You should start having your blood tested for lipids and cholesterol at 39 years of age, then have this test every 5 years.  You may need to have your cholesterol levels checked more often if:  Your lipid  or cholesterol levels are high.  You are older than 39 years of age.  You are at high risk for heart disease.  CANCER SCREENING   Lung Cancer  Lung cancer screening is recommended for adults 55-80 years old who are at high risk for lung cancer because of a history of smoking.  A yearly low-dose CT scan of the lungs is recommended for people who:  Currently smoke.  Have quit within the past 15 years.  Have at least a 30-pack-year history of smoking. A pack year is smoking an average of one pack of cigarettes a day for 1 year.  Yearly screening should continue until it has been 15 years since you quit.  Yearly screening should stop if you develop a health problem that would prevent you from having lung cancer treatment.  Breast Cancer  Practice breast self-awareness. This means understanding how your breasts normally appear and feel.  It also means doing regular breast self-exams. Let your health care provider know about any changes, no matter how small.  If you are in your 20s or 30s, you should have a clinical breast exam (CBE) by a health care provider every 1-3 years as part of a regular health exam.  If you are 40 or older, have a CBE every year. Also consider having a breast X-ray (mammogram) every year.  If you have a family history of breast cancer, talk to your health care provider about genetic screening.  If you   are at high risk for breast cancer, talk to your health care provider about having an MRI and a mammogram every year.  Breast cancer gene (BRCA) assessment is recommended for women who have family members with BRCA-related cancers. BRCA-related cancers include:  Breast.  Ovarian.  Tubal.  Peritoneal cancers.  Results of the assessment will determine the need for genetic counseling and BRCA1 and BRCA2 testing. Cervical Cancer Your health care provider may recommend that you be screened regularly for cancer of the pelvic organs (ovaries, uterus, and  vagina). This screening involves a pelvic examination, including checking for microscopic changes to the surface of your cervix (Pap test). You may be encouraged to have this screening done every 3 years, beginning at age 21.  For women ages 30-65, health care providers may recommend pelvic exams and Pap testing every 3 years, or they may recommend the Pap and pelvic exam, combined with testing for human papilloma virus (HPV), every 5 years. Some types of HPV increase your risk of cervical cancer. Testing for HPV may also be done on women of any age with unclear Pap test results.  Other health care providers may not recommend any screening for nonpregnant women who are considered low risk for pelvic cancer and who do not have symptoms. Ask your health care provider if a screening pelvic exam is right for you.  If you have had past treatment for cervical cancer or a condition that could lead to cancer, you need Pap tests and screening for cancer for at least 20 years after your treatment. If Pap tests have been discontinued, your risk factors (such as having a new sexual partner) need to be reassessed to determine if screening should resume. Some women have medical problems that increase the chance of getting cervical cancer. In these cases, your health care provider may recommend more frequent screening and Pap tests. Colorectal Cancer  This type of cancer can be detected and often prevented.  Routine colorectal cancer screening usually begins at 39 years of age and continues through 39 years of age.  Your health care provider may recommend screening at an earlier age if you have risk factors for colon cancer.  Your health care provider may also recommend using home test kits to check for hidden blood in the stool.  A small camera at the end of a tube can be used to examine your colon directly (sigmoidoscopy or colonoscopy). This is done to check for the earliest forms of colorectal  cancer.  Routine screening usually begins at age 50.  Direct examination of the colon should be repeated every 5-10 years through 39 years of age. However, you may need to be screened more often if early forms of precancerous polyps or small growths are found. Skin Cancer  Check your skin from head to toe regularly.  Tell your health care provider about any new moles or changes in moles, especially if there is a change in a mole's shape or color.  Also tell your health care provider if you have a mole that is larger than the size of a pencil eraser.  Always use sunscreen. Apply sunscreen liberally and repeatedly throughout the day.  Protect yourself by wearing long sleeves, pants, a wide-brimmed hat, and sunglasses whenever you are outside. HEART DISEASE, DIABETES, AND HIGH BLOOD PRESSURE   High blood pressure causes heart disease and increases the risk of stroke. High blood pressure is more likely to develop in:  People who have blood pressure in the high end   of the normal range (130-139/85-89 mm Hg).  People who are overweight or obese.  People who are African American.  If you are 38-23 years of age, have your blood pressure checked every 3-5 years. If you are 61 years of age or older, have your blood pressure checked every year. You should have your blood pressure measured twice--once when you are at a hospital or clinic, and once when you are not at a hospital or clinic. Record the average of the two measurements. To check your blood pressure when you are not at a hospital or clinic, you can use:  An automated blood pressure machine at a pharmacy.  A home blood pressure monitor.  If you are between 45 years and 39 years old, ask your health care provider if you should take aspirin to prevent strokes.  Have regular diabetes screenings. This involves taking a blood sample to check your fasting blood sugar level.  If you are at a normal weight and have a low risk for diabetes,  have this test once every three years after 39 years of age.  If you are overweight and have a high risk for diabetes, consider being tested at a younger age or more often. PREVENTING INFECTION  Hepatitis B  If you have a higher risk for hepatitis B, you should be screened for this virus. You are considered at high risk for hepatitis B if:  You were born in a country where hepatitis B is common. Ask your health care provider which countries are considered high risk.  Your parents were born in a high-risk country, and you have not been immunized against hepatitis B (hepatitis B vaccine).  You have HIV or AIDS.  You use needles to inject street drugs.  You live with someone who has hepatitis B.  You have had sex with someone who has hepatitis B.  You get hemodialysis treatment.  You take certain medicines for conditions, including cancer, organ transplantation, and autoimmune conditions. Hepatitis C  Blood testing is recommended for:  Everyone born from [birth year] through 1965.  Anyone with known risk factors for hepatitis C. Sexually transmitted infections (STIs)  You should be screened for sexually transmitted infections (STIs) including gonorrhea and chlamydia if:  You are sexually active and are younger than 39 years of age.  You are older than 39 years of age and your health care provider tells you that you are at risk for this type of infection.  Your sexual activity has changed since you were last screened and you are at an increased risk for chlamydia or gonorrhea. Ask your health care provider if you are at risk.  If you do not have HIV, but are at risk, it may be recommended that you take a prescription medicine daily to prevent HIV infection. This is called pre-exposure prophylaxis (PrEP). You are considered at risk if:  You are sexually active and do not regularly use condoms or know the HIV status of your partner(s).  You take drugs by injection.  You are sexually  active with a partner who has HIV. Talk with your health care provider about whether you are at high risk of being infected with HIV. If you choose to begin PrEP, you should first be tested for HIV. You should then be tested every 3 months for as long as you are taking PrEP.  PREGNANCY   If you are premenopausal and you may become pregnant, ask your health care provider about preconception counseling.  If you may  become pregnant, take 400 to 800 micrograms (mcg) of folic acid every day.  If you want to prevent pregnancy, talk to your health care provider about birth control (contraception). OSTEOPOROSIS AND MENOPAUSE   Osteoporosis is a disease in which the bones lose minerals and strength with aging. This can result in serious bone fractures. Your risk for osteoporosis can be identified using a bone density scan.  If you are 61 years of age or older, or if you are at risk for osteoporosis and fractures, ask your health care provider if you should be screened.  Ask your health care provider whether you should take a calcium or vitamin D supplement to lower your risk for osteoporosis.  Menopause may have certain physical symptoms and risks.  Hormone replacement therapy may reduce some of these symptoms and risks. Talk to your health care provider about whether hormone replacement therapy is right for you.  HOME CARE INSTRUCTIONS   Schedule regular health, dental, and eye exams.  Stay current with your immunizations.   Do not use any tobacco products including cigarettes, chewing tobacco, or electronic cigarettes.  If you are pregnant, do not drink alcohol.  If you are breastfeeding, limit how much and how often you drink alcohol.  Limit alcohol intake to no more than 1 drink per day for nonpregnant women. One drink equals 12 ounces of beer, 5 ounces of wine, or 1 ounces of hard liquor.  Do not use street drugs.  Do not share needles.  Ask your health care provider for help if  you need support or information about quitting drugs.  Tell your health care provider if you often feel depressed.  Tell your health care provider if you have ever been abused or do not feel safe at home.   This information is not intended to replace advice given to you by your health care provider. Make sure you discuss any questions you have with your health care provider.   Document Released: 11/12/2010 Document Revised: 05/20/2014 Document Reviewed: 03/31/2013 Elsevier Interactive Patient Education [employer].

## 2015-04-01 NOTE — Progress Notes (Signed)
Rita Dean  MRN: [id number] DOB: Sep 19, 1975  Subjective:  Pt presents to clinic for her CPE. She feels well and really has no complaints.  She would like her renal function checked because she was a kidney donor for her mom 5 years ago.  She has h/o iron def anemia but has stopped taking her iron and wants to know if she should still do it.  Last dental exam: every 6 months Last vision exam: last year Last pap smear: 2015 - [facility] Last mammogram: none  Vaccinations      Tetanus 2012      Flu - declined  Patient Active Problem List   Diagnosis Date Noted  . Willing to be kidney donor 04/20/2012  . Single kidney 07/09/2011    No current outpatient prescriptions on file prior to visit.   No current facility-administered medications on file prior to visit.    Allergies  Allergen Reactions  . Shellfish Allergy Rash    Social History   Social History  . Marital Status: Married    Spouse Name: Raquel Sarna  . Number of Children: 3  . Years of Education: Master's   Occupational History  . 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 Smoking status: Never Smoker   . Smokeless tobacco: Never Used  . Alcohol Use: No  . Drug Use: No  . Sexual Activity:    Partners: Male    Birth Control/ Protection: None   Other Topics Concern  . None   Social History Narrative   Lives with her husband and their three children.   Work - Sports administrator   Exercise - 2-3 times per week   Diet - healthy   Donated kidney to mom in 2011    Past Surgical History  Procedure Laterality Date  . Nephrectomy transplanted organ  2011    donated to her mother    Family History  Problem Relation Age of Onset  . Diabetes Mother   . Hypertension Mother   . Seizures Sister   . Asthma Son   . Diabetes Maternal Grandmother   . Stroke Maternal Grandmother     Review of Systems  Constitutional: Negative.   HENT: Negative.   Eyes: Negative.     Respiratory: Negative.   Cardiovascular: Negative.   Gastrointestinal: Negative.   Endocrine: Negative.   Genitourinary: Positive for menstrual problem (heavy).  Musculoskeletal: Negative.   Skin: Negative.   Allergic/Immunologic: Negative.   Neurological: Negative.   Hematological: Negative.   Psychiatric/Behavioral: Negative.    Objective:  BP 114/80 mmHg  Pulse 84  Temp(Src) 98.8 F (37.1 C) (Oral)  Resp 18  Ht 5' 7.25" (1.708 m)  Wt 144 lb 6.4 oz (65.499 kg)  BMI 22.45 kg/m2  SpO2 98%  LMP 03/16/2015  Physical Exam  Constitutional: She is oriented to person, place, and time and well-developed, well-nourished, and in no distress.  HENT:  Head: Normocephalic and atraumatic.  Right Ear: Hearing, tympanic membrane, external ear and ear canal normal.  Left Ear: Hearing, tympanic membrane, external ear and ear canal normal.  Nose: Nose normal.  Mouth/Throat: Uvula is midline, oropharynx is clear and moist and mucous membranes are normal.  Eyes: Conjunctivae and EOM are normal. Pupils are equal, round, and reactive to light.  Neck: Trachea normal and normal range of motion. Neck supple. No thyroid mass and no thyromegaly present.  Cardiovascular: Normal rate, regular rhythm and normal heart sounds.  No murmur heard. Pulmonary/Chest: Effort normal and breath sounds normal. She has no wheezes. Right breast exhibits no inverted nipple, no mass, no nipple discharge, no skin change and no tenderness. Left breast exhibits no inverted nipple, no mass, no nipple discharge, no skin change and no tenderness. Breasts are symmetrical.  Abdominal: Soft. Bowel sounds are normal. There is no tenderness.  Genitourinary: Uterus normal, cervix normal, right adnexa normal, left adnexa normal and vulva normal. Creamy  odorless  white and vaginal discharge found.  Musculoskeletal: Normal range of motion.  Lymphadenopathy:    She has no cervical adenopathy.  Neurological: She is alert and  oriented to person, place, and time. She has normal motor skills, normal sensation, normal strength and normal reflexes. Gait normal.  Skin: Skin is warm and dry.  Psychiatric: Mood, memory, affect and judgment normal.      Visual Acuity Screening   Right eye Left eye Both eyes  Without correction: 20/10-1 20/10-1 20/10-1  With correction:      Results for orders placed or performed in visit on 04/01/15  POCT Wet + KOH Prep  Result Value Ref Range   Yeast by KOH Absent Present, Absent   Yeast by wet prep Absent Present, Absent   WBC by wet prep Moderate (A) None, Few, Too numerous to count   Clue Cells Wet Prep HPF POC Few (A) None, Too numerous to count   Trich by wet prep Absent Present, Absent   Bacteria Wet Prep HPF POC Moderate (A) None, Few, Too numerous to count   Epithelial Cells By [employer] Pref ([facility]) Moderate (A) None, Few, Too numerous to count   RBC,UR,HPF,POC None None RBC/hpf     Assessment and Plan :  Annual physical exam - anticipatory guidance given  Screening cholesterol level - Plan: Lipid panel  Screening for HIV (human immunodeficiency virus) - Plan: HIV antibody  Screening for STD (sexually transmitted disease) - Plan: RPR  Encounter for routine gynecological examination - Plan: Pap IG, CT/NG NAA, and HPV (high risk)  History of abnormal cervical Pap smear - Plan: Pap IG, CT/NG NAA, and HPV (high risk)  Anemia, iron deficiency - Plan: CBC with Differential/Platelet, IBC Panel - check labs - will start ferrous gluconate if iron levels are low  Kidney donor - Plan: COMPLETE METABOLIC PANEL WITH GFR  Screening for metabolic disorder - Plan: COMPLETE METABOLIC PANEL WITH GFR  Other fatigue - Plan: CBC with Differential/Platelet, TSH, IBC Panel  Vaginal discharge - Plan: POCT Wet + KOH Prep - seems to be physiologic discharge  Windell Hummingbird PA-C  [facility] 04/01/2015 12:50 PM

## 2015-04-02 LAB — RPR

## 2015-04-03 LAB — LIPID PANEL
Cholesterol: 158 mg/dL (ref 125–200)
HDL: 102 mg/dL (ref >=46)
LDL CALC: 50 mg/dL (ref <130)
Total CHOL/HDL Ratio: 1.5 Ratio (ref <=5.0)
Triglycerides: 32 mg/dL (ref <150)
VLDL: 6 mg/dL (ref <30)

## 2015-04-03 LAB — COMPLETE METABOLIC PANEL WITH GFR
ALT: 16 U/L (ref 6–29)
AST: 24 U/L (ref 10–30)
Albumin: 3.9 g/dL (ref 3.6–5.1)
Alkaline Phosphatase: 60 U/L (ref 33–115)
BILIRUBIN TOTAL: 0.9 mg/dL (ref 0.2–1.2)
BUN: 10 mg/dL (ref 7–25)
CHLORIDE: 106 mmol/L (ref 98–110)
CO2: 26 mmol/L (ref 20–31)
Calcium: 8.8 mg/dL (ref 8.6–10.2)
Creat: 1.23 mg/dL — ABNORMAL HIGH (ref 0.50–1.10)
GFR, EST NON AFRICAN AMERICAN: 55 mL/min — AB (ref >=60)
GFR, Est African American: 64 mL/min (ref >=60)
GLUCOSE: 79 mg/dL (ref 65–99)
Potassium: 4.2 mmol/L (ref 3.5–5.3)
SODIUM: 137 mmol/L (ref 135–146)
TOTAL PROTEIN: 7 g/dL (ref 6.1–8.1)

## 2015-04-03 LAB — IBC PANEL
%SAT: 6 % — ABNORMAL LOW (ref 11–50)
TIBC: 401 ug/dL (ref 250–450)
UIBC: 378 ug/dL (ref 125–400)

## 2015-04-03 LAB — IRON: Iron: 23 ug/dL — ABNORMAL LOW (ref 40–190)

## 2015-04-04 LAB — PAP IG, CT-NG NAA, HPV HIGH-RISK
Chlamydia Probe Amp: NEGATIVE
GC PROBE AMP: NEGATIVE
HPV DNA High Risk: DETECTED — AB

## 2015-04-08 MED ORDER — FERROUS GLUCONATE 324 (38 FE) MG PO TABS: 324.0000 mg | ORAL_TABLET | Freq: Two times a day (BID) | ORAL | Status: DC

## 2015-04-08 NOTE — Addendum Note (Signed)
Addended by: Mancel Bale on: 04/08/2015 10:46 AM   Modules accepted: Orders, SmartSet

## 2015-05-31 ENCOUNTER — Telehealth: Payer: Self-pay | Admitting: Physician Assistant

## 2015-05-31 DIAGNOSIS — R87610 Atypical squamous cells of undetermined significance on cytologic smear of cervix (ASC-US): Secondary | ICD-10-CM

## 2015-05-31 NOTE — Telephone Encounter (Signed)
Referral done

## 2015-05-31 NOTE — Telephone Encounter (Signed)
-----   Message from Constance Goltz, [place] sent at 05/29/2015  2:40 PM EST ----- Pt did get MyChart results. States she replied back to you that she would like a referral but I don't really see the reply. Please refer. Thanks

## 2017-02-08 ENCOUNTER — Encounter: Payer: 59 | Admitting: Physician Assistant

## 2017-02-15 ENCOUNTER — Encounter: Payer: Self-pay | Admitting: Physician Assistant

## 2017-02-15 ENCOUNTER — Ambulatory Visit (INDEPENDENT_AMBULATORY_CARE_PROVIDER_SITE_OTHER): Payer: 59 | Admitting: Physician Assistant

## 2017-02-15 VITALS — BP 102/58 | HR 88 | Temp 98.0°F | Resp 16 | Ht 66.54 in | Wt 132.0 lb

## 2017-02-15 DIAGNOSIS — Z13220 Encounter for screening for lipoid disorders: Secondary | ICD-10-CM | POA: Diagnosis not present

## 2017-02-15 DIAGNOSIS — Z0000 Encounter for general adult medical examination without abnormal findings: Secondary | ICD-10-CM

## 2017-02-15 DIAGNOSIS — Z113 Encounter for screening for infections with a predominantly sexual mode of transmission: Secondary | ICD-10-CM

## 2017-02-15 DIAGNOSIS — F5089 Other specified eating disorder: Secondary | ICD-10-CM | POA: Diagnosis not present

## 2017-02-15 DIAGNOSIS — R5383 Other fatigue: Secondary | ICD-10-CM | POA: Diagnosis not present

## 2017-02-15 DIAGNOSIS — Z23 Encounter for immunization: Secondary | ICD-10-CM

## 2017-02-15 DIAGNOSIS — Z124 Encounter for screening for malignant neoplasm of cervix: Secondary | ICD-10-CM | POA: Diagnosis not present

## 2017-02-15 LAB — POCT WET + KOH PREP
TRICH BY WET PREP: ABSENT
YEAST BY KOH: ABSENT
Yeast by wet prep: ABSENT

## 2017-02-15 NOTE — Progress Notes (Signed)
Patient ID: Rita Dean, female    DOB: 1975-10-29, 41 y.o.   MRN: [id number]  PCP: Mancel Bale, PA-C  Chief Complaint  Patient presents with  . Annual Exam    with PAP    Subjective:   Presents for [employer] Visit.  Cervical Cancer Screening: ASCUS +HPV 03/29/2015. Referred to GYN. Breast Cancer Screening: monthly SBE, no previous mammogram Colorectal Cancer Screening: not yet a candidate Bone Density Testing: not yet a candidate HIV Screening: NEGATIVE 04/01/2015 STI Screening: desires this today Seasonal Influenza Vaccination: initially declines, but then agrees with discussion Td/Tdap Vaccination: 05/12/2011 Pneumococcal Vaccination: Pneumovax-23 04/2010 Zoster Vaccination: not yet a candidate Frequency of Dental evaluation: Q6 months  She is anemic, and was prescribed ferrous gluconate 03/2015, but stopped it after 1 month due to severe constipation and no improvement in her energy level.  For the past month she had increasing fatigue. She has been caring for her mother, who lives with her, and was recently hospitalized due to internal bleeding. She was back and forth to the hospital frequently x 2 weeks, and hasn't really been able to catch up. Sleeps 6-7 hours of sleep/night. Gets up x 2 to urinate, relating that she drinks ice water in the evenings. She chews on ice throughout the day. She has noticed that her tongue has become more smooth than usual and burns with acidic foods/drinks. Increased iron-containing foods in her diet for the past month.  Patient Active Problem List   Diagnosis Date Noted  . Kidney donor 04/01/2015  . Single kidney 07/09/2011    Past Medical History:  Diagnosis Date  . Allergy   . Anemia      Prior to Admission medications   Not on File    Allergies  Allergen Reactions  . Shellfish Allergy Rash    Past Surgical History:  Procedure Laterality Date  . NEPHRECTOMY TRANSPLANTED ORGAN  2011   donated to her  mother    Family History  Problem Relation Age of Onset  . Diabetes Mother   . Hypertension Mother   . Seizures Sister   . Asthma Son   . Diabetes Maternal Grandmother   . Stroke Maternal Grandmother     Social History   Social History  . Marital status: Married    Spouse name: Shawn  . Number of children: 3  . Years of education: Master's   Occupational History  . 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 Smoking status: Never Smoker  . Smokeless tobacco: Never Used  . Alcohol use No  . Drug use: No  . Sexual activity: Yes    Partners: Male    Birth control/ protection: None   Other Topics Concern  . None   Social History Narrative   Lives with her husband and their three children.   Work - Sports administrator   Exercise - 2-3 times per week   Diet - healthy   Donated kidney to mom in 2011       Review of Systems  Constitutional: Positive for fatigue. Negative for activity change, appetite change, chills, diaphoresis, fever and unexpected weight change.  HENT: Negative.   Eyes: Negative.   Respiratory: Negative.   Cardiovascular: Negative.   Gastrointestinal: Negative.   Endocrine: Negative.   Genitourinary: Negative.   Musculoskeletal: Negative.   Skin: Negative.   Allergic/Immunologic: Negative.   Neurological: Negative.   Hematological: Negative.   Psychiatric/Behavioral: Positive for  decreased concentration and sleep disturbance.        Objective:  Physical Exam  Constitutional: She is oriented to person, place, and time. Vital signs are normal. She appears well-developed and well-nourished. She is active and cooperative. No distress.  BP (!) 102/58   Pulse 88   Temp 98 F (36.7 C) (Oral)   Resp 16   Ht 5' 6.53" (1.69 m)   Wt 132 lb (59.9 kg)   LMP 02/05/2017 (Approximate)   SpO2 100%   BMI 20.96 kg/m    HENT:  Head: Normocephalic and atraumatic.  Right Ear: Hearing, tympanic membrane,  external ear and ear canal normal. No foreign bodies.  Left Ear: Hearing, tympanic membrane, external ear and ear canal normal. No foreign bodies.  Nose: Nose normal.  Mouth/Throat: Uvula is midline, oropharynx is clear and moist and mucous membranes are normal. No oral lesions. Normal dentition. No dental abscesses or uvula swelling. No oropharyngeal exudate.  Eyes: Pupils are equal, round, and reactive to light. Conjunctivae, EOM and lids are normal. Right eye exhibits no discharge. Left eye exhibits no discharge. No scleral icterus.  Fundoscopic exam:      The right eye shows no arteriolar narrowing, no AV nicking, no exudate, no hemorrhage and no papilledema.       The left eye shows no arteriolar narrowing, no AV nicking, no exudate, no hemorrhage and no papilledema.  Neck: Trachea normal, normal range of motion and full passive range of motion without pain. Neck supple. No spinous process tenderness and no muscular tenderness present. No thyroid mass and no thyromegaly present.  Cardiovascular: Normal rate, regular rhythm, normal heart sounds, intact distal pulses and normal pulses.   Pulmonary/Chest: Effort normal and breath sounds normal. She exhibits no tenderness and no retraction. Right breast exhibits no inverted nipple, no mass, no nipple discharge, no skin change and no tenderness. Left breast exhibits no inverted nipple, no mass, no nipple discharge, no skin change and no tenderness. Breasts are symmetrical.  Abdominal: Soft. Normal appearance and bowel sounds are normal. She exhibits no distension and no mass. There is no hepatosplenomegaly. There is no tenderness. There is no rigidity, no rebound, no guarding, no CVA tenderness, no tenderness at McBurney's point and negative Murphy's sign. No hernia. Hernia confirmed negative in the right inguinal area and confirmed negative in the left inguinal area.  Genitourinary: Rectum normal and uterus normal. Rectal exam shows no external  hemorrhoid and no fissure. No breast swelling, tenderness, discharge or bleeding. Pelvic exam was performed with patient supine. No labial fusion. There is no rash, tenderness, lesion or injury on the right labia. There is no rash, tenderness, lesion or injury on the left labia. Uterus is not deviated, not enlarged, not fixed and not tender. Cervix exhibits no motion tenderness, no discharge and no friability. Right adnexum displays no mass, no tenderness and no fullness. Left adnexum displays no mass, no tenderness and no fullness. No erythema, tenderness or bleeding in the vagina. No foreign body in the vagina. No signs of injury around the vagina. Vaginal discharge (moderate, no odor, thin, light yellow) found.  Musculoskeletal: She exhibits no edema or tenderness.       Cervical back: Normal.       Thoracic back: Normal.       Lumbar back: Normal.  Lymphadenopathy:       Head (right side): No tonsillar, no preauricular, no posterior auricular and no occipital adenopathy present.       Head (left  side): No tonsillar, no preauricular, no posterior auricular and no occipital adenopathy present.    She has no cervical adenopathy.    She has no axillary adenopathy.       Right: No inguinal and no supraclavicular adenopathy present.       Left: No inguinal and no supraclavicular adenopathy present.  Neurological: She is alert and oriented to person, place, and time. She has normal strength and normal reflexes. No cranial nerve deficit. She exhibits normal muscle tone. Coordination and gait normal.  Skin: Skin is warm, dry and intact. No rash noted. She is not diaphoretic. No cyanosis or erythema. Nails show no clubbing.  Psychiatric: She has a normal mood and affect. Her speech is normal and behavior is normal. Judgment and thought content normal.    Results for orders placed or performed in visit on 02/15/17  POCT Wet + KOH Prep  Result Value Ref Range   Yeast by KOH Absent Absent   Yeast by wet  prep Absent Absent   WBC by wet prep Too numerous to count  (A) Few   Clue Cells Wet Prep HPF POC None None   Trich by wet prep Absent Absent   Bacteria Wet Prep HPF POC Few Few   Epithelial Cells By [employer] Pref ([facility]) Many (A) None, Few, Too numerous to count   RBC,UR,HPF,POC None None RBC/hpf          Assessment & Plan:  1. Annual physical exam Age appropriate anticipatory guidance provided.  2. Pica Likely Iron deficiency anemia. - CBC - Iron, TIBC and Ferritin Panel  3. Fatigue, unspecified type Likely Iron Deficiency Anemia - CBC - Comprehensive metabolic panel  4. Screening for cervical cancer - Pap IG, CT/NG NAA, and HPV (high risk)  5. Screening for hyperlipidemia - Lipid panel  6. Routine screening for STI (sexually transmitted infection) - HIV antibody - Pap IG, CT/NG NAA, and HPV (high risk) - RPR - POCT Wet + KOH Prep  7. Need for influenza vaccination - Flu Vaccine QUAD 36+ mos IM    No Follow-up on file.   Fara Chute, PA-C [facility]

## 2017-02-15 NOTE — Patient Instructions (Addendum)
Take a stool softener with the iron supplement to help reduce constipation. If that is ineffective, add a magnesium supplement. Get plenty of dietary fiber, fluids (water is best), and physical activity.    IF you received an x-ray today, you will receive an invoice from [facility]. Please contact [facility] at [phone] with questions or concerns regarding your invoice.   IF you received labwork today, you will receive an invoice from [facility]. Please contact [facility] at [phone] with questions or concerns regarding your invoice.   Our billing staff will not be able to assist you with questions regarding bills from these companies.  You will be contacted with the lab results as soon as they are available. The fastest way to get your results is to activate your My Chart account. Instructions are located on the last page of this paperwork. If you have not heard from [place] regarding the results in 2 weeks, please contact this office.     Preventive Care 40-64 Years, Female Preventive care refers to lifestyle choices and visits with your health care provider that can promote health and wellness. What does preventive care include?  A yearly physical exam. This is also called an annual well check.  Dental exams once or twice a year.  Routine eye exams. Ask your health care provider how often you should have your eyes checked.  Personal lifestyle choices, including: ? Daily care of your teeth and gums. ? Regular physical activity. ? Eating a healthy diet. ? Avoiding tobacco and drug use. ? Limiting alcohol use. ? Practicing safe sex. ? Taking low-dose aspirin daily starting at age 41. ? Taking vitamin and mineral supplements as recommended by your health care provider. What happens during an annual well check? The services and screenings done by your health care provider during your annual well check will depend on your age, overall health, lifestyle risk factors,  and family history of disease. Counseling Your health care provider may ask you questions about your:  Alcohol use.  Tobacco use.  Drug use.  Emotional well-being.  Home and relationship well-being.  Sexual activity.  Eating habits.  Work and work Statistician.  Method of birth control.  Menstrual cycle.  Pregnancy history.  Screening You may have the following tests or measurements:  Height, weight, and BMI.  Blood pressure.  Lipid and cholesterol levels. These may be checked every 5 years, or more frequently if you are over 41 years old.  Skin check.  Lung cancer screening. You may have this screening every year starting at age 41 if you have a 30-pack-year history of smoking and currently smoke or have quit within the past 15 years.  Fecal occult blood test (FOBT) of the stool. You may have this test every year starting at age 41.  Flexible sigmoidoscopy or colonoscopy. You may have a sigmoidoscopy every 5 years or a colonoscopy every 10 years starting at age 41.  Hepatitis C blood test.  Hepatitis B blood test.  Sexually transmitted disease (STD) testing.  Diabetes screening. This is done by checking your blood sugar (glucose) after you have not eaten for a while (fasting). You may have this done every 1-3 years.  Mammogram. This may be done every 1-2 years. Talk to your health care provider about when you should start having regular mammograms. This may depend on whether you have a family history of breast cancer.  BRCA-related cancer screening. This may be done if you have a family history of breast, ovarian, tubal, or  peritoneal cancers.  Pelvic exam and Pap test. This may be done every 3 years starting at age 41. Starting at age 41, this may be done every 5 years if you have a Pap test in combination with an HPV test.  Bone density scan. This is done to screen for osteoporosis. You may have this scan if you are at high risk for osteoporosis.  Discuss  your test results, treatment options, and if necessary, the need for more tests with your health care provider. Vaccines Your health care provider may recommend certain vaccines, such as:  Influenza vaccine. This is recommended every year.  Tetanus, diphtheria, and acellular pertussis (Tdap, Td) vaccine. You may need a Td booster every 10 years.  Varicella vaccine. You may need this if you have not been vaccinated.  Zoster vaccine. You may need this after age 41.  Measles, mumps, and rubella (MMR) vaccine. You may need at least one dose of MMR if you were born in 1957 or later. You may also need a second dose.  Pneumococcal 13-valent conjugate (PCV13) vaccine. You may need this if you have certain conditions and were not previously vaccinated.  Pneumococcal polysaccharide (PPSV23) vaccine. You may need one or two doses if you smoke cigarettes or if you have certain conditions.  Meningococcal vaccine. You may need this if you have certain conditions.  Hepatitis A vaccine. You may need this if you have certain conditions or if you travel or work in places where you may be exposed to hepatitis A.  Hepatitis B vaccine. You may need this if you have certain conditions or if you travel or work in places where you may be exposed to hepatitis B.  Haemophilus influenzae type b (Hib) vaccine. You may need this if you have certain conditions.  Talk to your health care provider about which screenings and vaccines you need and how often you need them. This information is not intended to replace advice given to you by your health care provider. Make sure you discuss any questions you have with your health care provider. Document Released: 05/26/2015 Document Revised: 01/17/2016 Document Reviewed: 02/28/2015 Elsevier Interactive Patient Education  2017 [employer].

## 2017-02-15 NOTE — Progress Notes (Signed)
Subjective:    Patient ID: Rita Dean, female    DOB: 1976-03-07, 41 y.o.   MRN: [id number]  HPI Chief Complaint  Patient presents with  . Annual Exam    with PAP    She returns today for a complete physical exam.  She was prescribed ferrous gluconate at her last office visit in 03/2015 but only took it for a month. She did not notice any difference in energy level and was severely constipated from it so she discontinued it. Her energy level had been okay until this past month. Over last month, she has been feeling more tired. She has been taking care of her sick mom who lives with her, which may contribute to her fatigue. Her mother was hospitalized for 2 weeks due to internal bleeding and patient was going back and forth to the hospital to see her while working full time. She does not feel like she has had a chance to catch up on her rest. She gets about 6.5-7 hours of sleep a night, she will get up at least 2 times a night to urinate because she drinks 1-2 cups of water with ice at night. Patient describes chewing ice throughout the day and has noticed a smooth tongue. She notes her tongue burns when she eats anything acidic. Patient denies any recent illness.  She notes she has increased dietary iron through eating spinach and beets daily for about 1 month.   Self breast exam at home: yes Last dental exam: last year Last eye exam: a "few years ago"   Review of Systems  Constitutional: Negative for appetite change.  HENT: Negative for mouth sores, sore throat and tinnitus.   Eyes: Negative for visual disturbance.  Respiratory: Negative for shortness of breath.   Cardiovascular: Negative for chest pain, palpitations and leg swelling.  Gastrointestinal: Negative for abdominal pain, blood in stool, constipation, diarrhea, nausea and vomiting.  Musculoskeletal: Negative for arthralgias and myalgias.  Neurological: Negative for dizziness, tremors, syncope, weakness, numbness and  headaches.  Psychiatric/Behavioral: Positive for decreased concentration.   Patient Active Problem List   Diagnosis Date Noted  . Kidney donor 04/01/2015  . Single kidney 07/09/2011   Prior to Admission medications   Not on File   Allergies  Allergen Reactions  . Shellfish Allergy Rash   Social History   Social History  . Marital status: Married    Spouse name: Shawn  . Number of children: 3  . Years of education: Master's   Occupational History  . 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 Smoking status: Never Smoker  . Smokeless tobacco: Never Used  . Alcohol use No  . Drug use: No  . Sexual activity: Yes    Partners: Male    Birth control/ protection: None   Other Topics Concern  . Not on file   Social History Narrative   Lives with her husband and their three children.   Work - Sports administrator   Exercise - 2-3 times per week   Diet - healthy   Donated kidney to mom in 2011      Objective:   Physical Exam  Constitutional: She is oriented to person, place, and time. She appears well-developed and well-nourished. No distress.  HENT:  Head: Normocephalic and atraumatic.  Right Ear: External ear normal.  Left Ear: External ear normal.  Mouth/Throat: No oropharyngeal exudate, posterior oropharyngeal edema, posterior oropharyngeal erythema or tonsillar abscesses.  Neck: Neck supple. No JVD present. No tracheal deviation present. No thyromegaly present.  Cardiovascular: Normal rate, regular rhythm, normal heart sounds and intact distal pulses.  Exam reveals no gallop and no friction rub.   No murmur heard. Pulmonary/Chest: Effort normal and breath sounds normal. No stridor. No respiratory distress. She has no wheezes. She has no rales. She exhibits no tenderness.  Abdominal: Soft. Bowel sounds are normal. She exhibits no distension and no mass. There is no tenderness. There is no rebound and no guarding. Hernia  confirmed negative in the right inguinal area and confirmed negative in the left inguinal area.  Genitourinary: No breast swelling, tenderness, discharge or bleeding. Pelvic exam was performed with patient supine. No labial fusion. There is no rash, tenderness, lesion or injury on the right labia. There is no rash, tenderness, lesion or injury on the left labia. Uterus is not deviated. Cervix exhibits no motion tenderness, no discharge and no friability. Right adnexum displays no mass, no tenderness and no fullness. Left adnexum displays no mass, no tenderness and no fullness. No erythema, tenderness or bleeding in the vagina. No foreign body in the vagina. No signs of injury around the vagina. Vaginal discharge found.    Lymphadenopathy:    She has no cervical adenopathy.       Right: No inguinal adenopathy present.       Left: No inguinal adenopathy present.  Neurological: She is alert and oriented to person, place, and time. She has normal reflexes. No cranial nerve deficit.  Skin: Skin is warm and dry. No rash noted. She is not diaphoretic. No erythema. No pallor.  Psychiatric: She has a normal mood and affect. Her behavior is normal. Judgment and thought content normal.     Assessment & Plan:  1. Annual physical exam  2. Pica  - Instructed patient to resume taking iron supplements  - CBC - Iron, TIBC and Ferritin Panel  3. Fatigue, unspecified type - CBC - Comprehensive metabolic panel  4. Screening for cervical cancer - Pap IG, CT/NG NAA, and HPV (high risk)  5. Screening for hyperlipidemia - Lipid panel  6. Routine screening for STI (sexually transmitted infection) - HIV antibody - Pap IG, CT/NG NAA, and HPV (high risk) - RPR - POCT Wet + KOH Prep  7. Need for influenza vaccination - Completed in clinic today - Flu Vaccine QUAD 36+ mos IM  Respectfully, Denny Levy PA-S 2019

## 2017-02-16 LAB — LIPID PANEL
CHOL/HDL RATIO: 1.9 ratio (ref 0.0–4.4)
CHOLESTEROL TOTAL: 165 mg/dL (ref 100–199)
HDL: 88 mg/dL (ref >39)
LDL CALC: 70 mg/dL (ref 0–99)
TRIGLYCERIDES: 35 mg/dL (ref 0–149)
VLDL Cholesterol Cal: 7 mg/dL (ref 5–40)

## 2017-02-16 LAB — IRON,TIBC AND FERRITIN PANEL
FERRITIN: 3 ng/mL — AB (ref 15–150)
IRON SATURATION: 3 % — AB (ref 15–55)
Iron: 11 ug/dL — ABNORMAL LOW (ref 27–159)
TIBC: 407 ug/dL (ref 250–450)
UIBC: 396 ug/dL (ref 131–425)

## 2017-02-16 LAB — CBC
Hematocrit: 25.5 % — ABNORMAL LOW (ref 34.0–46.6)
Hemoglobin: 7.4 g/dL — ABNORMAL LOW (ref 11.1–15.9)
MCH: 17.1 pg — ABNORMAL LOW (ref 26.6–33.0)
MCHC: 29 g/dL — AB (ref 31.5–35.7)
MCV: 59 fL — ABNORMAL LOW (ref 79–97)
Platelets: 429 10*3/uL — ABNORMAL HIGH (ref 150–379)
RBC: 4.34 x10E6/uL (ref 3.77–5.28)
RDW: 18.5 % — AB (ref 12.3–15.4)
WBC: 4.6 10*3/uL (ref 3.4–10.8)

## 2017-02-16 LAB — COMPREHENSIVE METABOLIC PANEL
ALK PHOS: 66 IU/L (ref 39–117)
ALT: 13 IU/L (ref 0–32)
AST: 20 IU/L (ref 0–40)
Albumin/Globulin Ratio: 1.5 (ref 1.2–2.2)
Albumin: 4.4 g/dL (ref 3.5–5.5)
BUN/Creatinine Ratio: 8 — ABNORMAL LOW (ref 9–23)
BUN: 10 mg/dL (ref 6–24)
Bilirubin Total: 0.6 mg/dL (ref 0.0–1.2)
CALCIUM: 9 mg/dL (ref 8.7–10.2)
CO2: 22 mmol/L (ref 20–29)
CREATININE: 1.3 mg/dL — AB (ref 0.57–1.00)
Chloride: 102 mmol/L (ref 96–106)
GFR calc Af Amer: 59 mL/min/{1.73_m2} — ABNORMAL LOW (ref >59)
GFR, EST NON AFRICAN AMERICAN: 51 mL/min/{1.73_m2} — AB (ref >59)
GLUCOSE: 93 mg/dL (ref 65–99)
Globulin, Total: 3 g/dL (ref 1.5–4.5)
Potassium: 4.5 mmol/L (ref 3.5–5.2)
SODIUM: 137 mmol/L (ref 134–144)
Total Protein: 7.4 g/dL (ref 6.0–8.5)

## 2017-02-16 LAB — RPR: RPR Ser Ql: NONREACTIVE

## 2017-02-16 LAB — HIV ANTIBODY (ROUTINE TESTING W REFLEX): HIV Screen 4th Generation wRfx: NONREACTIVE

## 2017-02-19 LAB — PAP IG, CT-NG NAA, HPV HIGH-RISK
CHLAMYDIA, NUC. ACID AMP: NEGATIVE
Gonococcus by Nucleic Acid Amp: NEGATIVE
HPV, high-risk: NEGATIVE
PAP Smear Comment: 0

## 2017-02-21 ENCOUNTER — Encounter: Payer: Self-pay | Admitting: Physician Assistant

## 2017-02-21 DIAGNOSIS — D500 Iron deficiency anemia secondary to blood loss (chronic): Secondary | ICD-10-CM | POA: Insufficient documentation

## 2017-02-21 DIAGNOSIS — N924 Excessive bleeding in the premenopausal period: Secondary | ICD-10-CM | POA: Insufficient documentation

## 2017-02-21 NOTE — Telephone Encounter (Signed)
Discussed her lab results.  Does have heavy menses. Has seen GYN previously, who recommended COC. She declined, not interested in hormones.  Due to symptomatic anemia and side effects of oral iron supplementation, she is interested in exploring options to address her heavy bleeding and also the possibility of iron infusions. Referrals placed.

## 2017-03-27 ENCOUNTER — Ambulatory Visit (INDEPENDENT_AMBULATORY_CARE_PROVIDER_SITE_OTHER): Payer: 59 | Admitting: Obstetrics & Gynecology

## 2017-03-27 ENCOUNTER — Encounter: Payer: Self-pay | Admitting: Obstetrics & Gynecology

## 2017-03-27 VITALS — BP 110/70 | Ht 66.0 in | Wt 133.0 lb

## 2017-03-27 DIAGNOSIS — D649 Anemia, unspecified: Secondary | ICD-10-CM

## 2017-03-27 DIAGNOSIS — N920 Excessive and frequent menstruation with regular cycle: Secondary | ICD-10-CM

## 2017-03-27 LAB — CBC
HCT: 28.1 % — ABNORMAL LOW (ref 35.0–45.0)
Hemoglobin: 8.2 g/dL — ABNORMAL LOW (ref 11.7–15.5)
MCH: 19.5 pg — AB (ref 27.0–33.0)
MCHC: 29.2 g/dL — ABNORMAL LOW (ref 32.0–36.0)
MCV: 66.7 fL — AB (ref 80.0–100.0)
PLATELETS: 279 10*3/uL (ref 140–400)
RBC: 4.21 10*6/uL (ref 3.80–5.10)
RDW: 24.9 % — AB (ref 11.0–15.0)
WBC: 4.1 10*3/uL (ref 3.8–10.8)

## 2017-03-27 LAB — TSH: TSH: 0.94 m[IU]/L

## 2017-03-27 MED ORDER — NORETHINDRONE 0.35 MG PO TABS: 1.0000 | ORAL_TABLET | Freq: Every day | ORAL | 3 refills | Status: DC

## 2017-03-27 NOTE — Progress Notes (Signed)
    Rita Dean Sep 11, 1975 [id number]        41 y.o.  G4P4L3  Married.  2 sons, youngest is a daughter.  RP:  Heavy and long menstrual periods  HPI:  Longstanding menses every month with heavy flow x 6 days.  Heaviest x 5 days every month.  Hb 7.4 on 02/15/2017.  On Iron supplements.  Appointment scheduled with [facility] next week.  No contraception, ok if conceives.  No pelvic pain.  Very fit, trains every day, aerobics and weight lifting.  Stopped aerobics recently because of dizziness, near fainting.  Past medical history,surgical history, problem list, medications, allergies, family history and social history were all reviewed and documented in the EPIC chart.  Directed ROS with pertinent positives and negatives documented in the history of present illness/assessment and plan.  Exam:  Vitals:   03/27/17 1210  BP: 110/70  Weight: 133 lb (60.3 kg)  Height: 5\' 6"  (1.676 m)   General appearance:  Normal  Gyn exam:  Vulva normal.  Bimanual exam:  Uterus AV, mildly increased in volume, mobile, NT.  No adnexal mass felt, NT.   Assessment/Plan:  41 y.o. G4P4   1. Menorrhagia with regular cycle Severe long-standing menorrhagia with secondary anemia.  Already taking iron supplement per mouth.  Will recheck on CBC and draw a TSH today.  Start on Camila to control menstrual flow.  Usage, risks and benefits thoroughly reviewed.  Will follow up for a pelvic ultrasound to rule out intrauterine pathology. - US Transvaginal Non-OB; Future - CBC - TSH  2. Secondary anemia Recommended IV Iron, but referred to [facility], has appointment next week, will wait on [facility] recommendations. - US Transvaginal Non-OB; Future - CBC  Counseling on above issues >50% x 25 minutes.  Princess Bruins MD, 12:21 PM 03/27/2017

## 2017-03-30 NOTE — Patient Instructions (Signed)
1. Menorrhagia with regular cycle Severe long-standing menorrhagia with secondary anemia.  Already taking iron supplement per mouth.  Will recheck on CBC and draw a TSH today.  Start on Camila to control menstrual flow.  Usage, risks and benefits thoroughly reviewed.  Will follow up for a pelvic ultrasound to rule out intrauterine pathology. - US Transvaginal Non-OB; Future - CBC - TSH  2. Secondary anemia Recommended IV Iron, but referred to [facility], has appointment next week, will wait on [facility] recommendations. - US Transvaginal Non-OB; Future - CBC  Rita Dean, it was a pleasure meeting you today!  I will inform you of your results as soon as available and see you again soon for the Pelvic ultrasound.

## 2017-04-01 ENCOUNTER — Telehealth: Payer: Self-pay | Admitting: Hematology and Oncology

## 2017-04-01 ENCOUNTER — Encounter: Payer: Self-pay | Admitting: Hematology and Oncology

## 2017-04-01 ENCOUNTER — Ambulatory Visit (HOSPITAL_BASED_OUTPATIENT_CLINIC_OR_DEPARTMENT_OTHER): Payer: 59 | Admitting: Hematology and Oncology

## 2017-04-01 ENCOUNTER — Ambulatory Visit (HOSPITAL_BASED_OUTPATIENT_CLINIC_OR_DEPARTMENT_OTHER): Payer: 59

## 2017-04-01 VITALS — BP 106/69 | HR 82 | Temp 98.7°F | Resp 18 | Ht 66.0 in | Wt 138.4 lb

## 2017-04-01 DIAGNOSIS — N920 Excessive and frequent menstruation with regular cycle: Secondary | ICD-10-CM | POA: Diagnosis not present

## 2017-04-01 DIAGNOSIS — Z524 Kidney donor: Secondary | ICD-10-CM | POA: Diagnosis not present

## 2017-04-01 DIAGNOSIS — D500 Iron deficiency anemia secondary to blood loss (chronic): Secondary | ICD-10-CM

## 2017-04-01 DIAGNOSIS — Z8042 Family history of malignant neoplasm of prostate: Secondary | ICD-10-CM | POA: Diagnosis not present

## 2017-04-01 DIAGNOSIS — Z905 Acquired absence of kidney: Secondary | ICD-10-CM

## 2017-04-01 LAB — COMPREHENSIVE METABOLIC PANEL
ALT: 13 U/L (ref 0–55)
AST: 21 U/L (ref 5–34)
Albumin: 3.8 g/dL (ref 3.5–5.0)
Alkaline Phosphatase: 57 U/L (ref 40–150)
Anion Gap: 7 mEq/L (ref 3–11)
BUN: 10.2 mg/dL (ref 7.0–26.0)
CO2: 26 mEq/L (ref 22–29)
Calcium: 8.8 mg/dL (ref 8.4–10.4)
Chloride: 106 mEq/L (ref 98–109)
Creatinine: 1.2 mg/dL — ABNORMAL HIGH (ref 0.6–1.1)
EGFR: >60 mL/min/{1.73_m2} (ref >60)
Glucose: 83 mg/dl (ref 70–140)
Potassium: 3.9 mEq/L (ref 3.5–5.1)
Sodium: 139 mEq/L (ref 136–145)
Total Bilirubin: 0.79 mg/dL (ref 0.20–1.20)
Total Protein: 7.2 g/dL (ref 6.4–8.3)

## 2017-04-01 LAB — CBC & DIFF AND RETIC
BASO%: 1.8 % (ref 0.0–2.0)
Basophils Absolute: 0.1 10*3/uL (ref 0.0–0.1)
EOS%: 11.2 % — ABNORMAL HIGH (ref 0.0–7.0)
Eosinophils Absolute: 0.4 10*3/uL (ref 0.0–0.5)
HCT: 28.7 % — ABNORMAL LOW (ref 34.8–46.6)
HGB: 8.7 g/dL — ABNORMAL LOW (ref 11.6–15.9)
Immature Retic Fract: 8.9 % (ref 1.60–10.00)
LYMPH%: 35 % (ref 14.0–49.7)
MCH: 19.8 pg — ABNORMAL LOW (ref 25.1–34.0)
MCHC: 30.4 g/dL — ABNORMAL LOW (ref 31.5–36.0)
MCV: 65.1 fL — ABNORMAL LOW (ref 79.5–101.0)
MONO#: 0.4 10*3/uL (ref 0.1–0.9)
MONO%: 10.5 % (ref 0.0–14.0)
NEUT#: 1.6 10*3/uL (ref 1.5–6.5)
NEUT%: 41.5 % (ref 38.4–76.8)
Platelets: 275 10*3/uL (ref 145–400)
RBC: 4.4 10*6/uL (ref 3.70–5.45)
RDW: 28.3 % — ABNORMAL HIGH (ref 11.2–14.5)
Retic %: 0.59 % — ABNORMAL LOW (ref 0.70–2.10)
Retic Ct Abs: 25.96 10*3/uL — ABNORMAL LOW (ref 33.70–90.70)
WBC: 4 10*3/uL (ref 3.9–10.3)
lymph#: 1.4 10*3/uL (ref 0.9–3.3)

## 2017-04-01 LAB — MORPHOLOGY
BLASTS: 1 % (ref 0–0)
PLT EST: 265

## 2017-04-01 LAB — IRON AND TIBC
%SAT: 76 % — AB (ref 21–57)
Iron: 278 ug/dL — ABNORMAL HIGH (ref 41–142)
TIBC: 365 ug/dL (ref 236–444)
UIBC: 87 ug/dL — AB (ref 120–384)

## 2017-04-01 LAB — FERRITIN: FERRITIN: 5 ng/mL — AB (ref 9–269)

## 2017-04-01 LAB — LACTATE DEHYDROGENASE: LDH: 139 U/L (ref 125–245)

## 2017-04-01 NOTE — Telephone Encounter (Signed)
Gave avs and calendar for November  °

## 2017-04-02 LAB — ERYTHROPOIETIN: Erythropoietin: 49.1 m[IU]/mL — ABNORMAL HIGH (ref 2.6–18.5)

## 2017-04-02 LAB — VITAMIN B12: VITAMIN B 12: 231 pg/mL — AB (ref 232–1245)

## 2017-04-02 LAB — FOLATE: Folate: 11.3 ng/mL (ref >3.0)

## 2017-04-02 LAB — HAPTOGLOBIN: Haptoglobin: 97 mg/dL (ref 34–200)

## 2017-04-08 ENCOUNTER — Telehealth: Payer: Self-pay | Admitting: Hematology and Oncology

## 2017-04-08 ENCOUNTER — Ambulatory Visit (HOSPITAL_BASED_OUTPATIENT_CLINIC_OR_DEPARTMENT_OTHER): Payer: 59 | Admitting: Hematology and Oncology

## 2017-04-08 ENCOUNTER — Ambulatory Visit (HOSPITAL_BASED_OUTPATIENT_CLINIC_OR_DEPARTMENT_OTHER): Payer: 59

## 2017-04-08 ENCOUNTER — Encounter: Payer: Self-pay | Admitting: Hematology and Oncology

## 2017-04-08 VITALS — BP 105/64 | HR 104 | Temp 98.8°F | Resp 19 | Ht 66.0 in | Wt 140.5 lb

## 2017-04-08 DIAGNOSIS — N920 Excessive and frequent menstruation with regular cycle: Secondary | ICD-10-CM

## 2017-04-08 DIAGNOSIS — D519 Vitamin B12 deficiency anemia, unspecified: Secondary | ICD-10-CM

## 2017-04-08 DIAGNOSIS — D500 Iron deficiency anemia secondary to blood loss (chronic): Secondary | ICD-10-CM

## 2017-04-08 LAB — COMPREHENSIVE METABOLIC PANEL
ALBUMIN: 3.8 g/dL (ref 3.5–5.0)
ALK PHOS: 59 U/L (ref 40–150)
ALT: 19 U/L (ref 0–55)
ANION GAP: 7 meq/L (ref 3–11)
AST: 24 U/L (ref 5–34)
BILIRUBIN TOTAL: 0.82 mg/dL (ref 0.20–1.20)
BUN: 10.3 mg/dL (ref 7.0–26.0)
CO2: 25 meq/L (ref 22–29)
CREATININE: 1.2 mg/dL — AB (ref 0.6–1.1)
Calcium: 9.2 mg/dL (ref 8.4–10.4)
Chloride: 107 mEq/L (ref 98–109)
EGFR: >60 mL/min/{1.73_m2} (ref >60)
GLUCOSE: 91 mg/dL (ref 70–140)
Potassium: 4 mEq/L (ref 3.5–5.1)
Sodium: 138 mEq/L (ref 136–145)
TOTAL PROTEIN: 7.4 g/dL (ref 6.4–8.3)

## 2017-04-08 LAB — IRON AND TIBC
%SAT: 24 % (ref 21–57)
IRON: 86 ug/dL (ref 41–142)
TIBC: 364 ug/dL (ref 236–444)
UIBC: 277 ug/dL (ref 120–384)

## 2017-04-08 LAB — FERRITIN: FERRITIN: 11 ng/mL (ref 9–269)

## 2017-04-08 NOTE — Telephone Encounter (Signed)
Gave avs and calendar for December and January 2019 °

## 2017-04-09 LAB — VITAMIN B12: VITAMIN B 12: 212 pg/mL — AB (ref 232–1245)

## 2017-04-09 LAB — FOLATE: FOLATE: 12.6 ng/mL (ref >3.0)

## 2017-04-10 LAB — METHYLMALONIC ACID, SERUM: Methylmalonic Acid, Serum: 228 nmol/L (ref 0–378)

## 2017-04-16 ENCOUNTER — Ambulatory Visit: Payer: 59 | Admitting: Obstetrics & Gynecology

## 2017-04-16 ENCOUNTER — Other Ambulatory Visit: Payer: 59

## 2017-04-19 NOTE — Assessment & Plan Note (Signed)
41 y.o. female with iron deficiency anemia due to heavy menstrual bleeding with difficulties tolerating oral iron supplementation.  Currently, appears to be tolerating liquid oral iron supplement.  Levels of iron have not been rechecked since she has started dyspnea supplementation.  Plan: --Recheck the labs as outlined below including vitamin levels and erythropoietin level on account of single kidney presence --Return to clinic in 1 week to discuss.  If iron numbers are improving, patient will stick with her current iron supplement.  If iron levels are still very low as they were, will consider administering Feraheme

## 2017-04-19 NOTE — Progress Notes (Signed)
[facility] New Visit:  Assessment: Iron deficiency anemia due to chronic blood loss 41 y.o. female with iron deficiency anemia due to heavy menstrual bleeding with difficulties tolerating oral iron supplementation.  Currently, appears to be tolerating liquid oral iron supplement.  Levels of iron have not been rechecked since she has started dyspnea supplementation.  Plan: --Recheck the labs as outlined below including vitamin levels and erythropoietin level on account of single kidney presence --Return to clinic in 1 week to discuss.  If iron numbers are improving, patient will stick with her current iron supplement.  If iron levels are still very low as they were, will consider administering Feraheme  Voice recognition software was used and creation of this note. Despite my best effort at editing the text, some misspelling/errors may have occurred.  Orders Placed This Encounter  Procedures  . CBC & Diff and Retic    Standing Status:   Future    Number of Occurrences:   1    Standing Expiration Date:   04/01/2018  . Morphology    Standing Status:   Future    Number of Occurrences:   1    Standing Expiration Date:   04/01/2018  . Comprehensive metabolic panel    Standing Status:   Future    Number of Occurrences:   1    Standing Expiration Date:   04/01/2018  . Lactate dehydrogenase (LDH)    Standing Status:   Future    Number of Occurrences:   1    Standing Expiration Date:   04/01/2018  . Ferritin    Standing Status:   Future    Number of Occurrences:   1    Standing Expiration Date:   04/01/2018  . Iron and TIBC    Standing Status:   Future    Number of Occurrences:   1    Standing Expiration Date:   04/01/2018  . Folate, Serum    Standing Status:   Future    Number of Occurrences:   1    Standing Expiration Date:   04/01/2018  . Vitamin B12    Standing Status:   Future    Number of Occurrences:   1    Standing Expiration Date:   04/01/2018  .  Haptoglobin    Standing Status:   Future    Number of Occurrences:   1    Standing Expiration Date:   04/01/2018  . Erythropoietin    Standing Status:   Future    Number of Occurrences:   1    Standing Expiration Date:   04/01/2018    All questions were answered.  . The patient knows to call the clinic with any problems, questions or concerns.  This note was electronically signed.    History of Presenting Illness Rita Dean 41 y.o. presenting to the [facility] for deficiency anemia due to chronic blood loss, referred by Harrison Mons, PA-C.  Patient's past medical history significant for single kidney secondary to donation in 2011.  Patient reports long-term history of iron deficiency.  Her symptoms include smoothing of the tongue, pica with ice cravings, soreness of the tongue which has recently improved.  In the past, she has been tried on ferrous gluconate for a month and that caused severe constipation no improvement in energy level.  Presently, she is taking a plant-based ferrous supplement  [name]).  With the more reliable oral intake of iron, she appears to  have improvement in her energy and resolution of the tongue discomfort.  Patient denies alcohol abuse or significant use.  She is not a smoker.  Oncological/hematological History: --Labs, 02/15/17: WBC 4.6, Hgb 7.4, MCV 59.0, MCH 17.1, RDW 18.5, Plt 429; Fe 11, FeSat 3%, TIBC 407, Ferritin 3;   Medical History: Past Medical History:  Diagnosis Date  . Allergy   . Anemia     Surgical History: Past Surgical History:  Procedure Laterality Date  . NEPHRECTOMY TRANSPLANTED ORGAN  2011   donated to her mother    Family History: Family History  Problem Relation Age of Onset  . Diabetes Mother   . Hypertension Mother   . Seizures Sister   . Asthma Son   . Diabetes Maternal Grandmother   . Stroke Maternal Grandmother   . Cancer Maternal Grandfather        prostate    Social  History: Social History   Socioeconomic History  . Marital status: Married    Spouse name: Shawn  . Number of children: 3  . Years of education: Master's  . Highest education level: Not on file  Social Needs  . Financial resource strain: Not on file  . Food insecurity - worry: Not on file  . Food insecurity - inability: Not on file  . Transportation needs - medical: Not on file  . Transportation needs - non-medical: Not on file  Occupational History  . Occupation: Solicitor    Comment: Engineering/IT company  Tobacco Use  . Smoking status: Never Smoker  . Smokeless tobacco: Never Used  Substance and Sexual Activity  . Alcohol use: No    Alcohol/week: 0.0 oz  . Drug use: No  . Sexual activity: Yes    Partners: Male    Birth control/protection: None    Comment: 1ST intercourse- 62, partners- 2, married- 1 yrs   Other Topics Concern  . Not on file  Social History Narrative   Lives with her husband and their three children.   Work - Sports administrator   Exercise - 2-3 times per week   Diet - healthy   Donated kidney to mom in 2011    Allergies: Allergies  Allergen Reactions  . Shellfish Allergy Rash    Medications:  Current Outpatient Medications  Medication Sig Dispense Refill  . IRON PO Take by mouth.    . norethindrone (MICRONOR,CAMILA,ERRIN) 0.35 MG tablet Take 1 tablet (0.35 mg total) daily by mouth. 3 Package 3   No current facility-administered medications for this visit.     Review of Systems: Review of Systems  Constitutional: Positive for fatigue.  All other systems reviewed and are negative.    PHYSICAL EXAMINATION Blood pressure 106/69, pulse 82, temperature 98.7 F (37.1 C), temperature source Oral, resp. rate 18, height '5\' 6"'$  (1.676 m), weight 138 lb 6.4 oz (62.8 kg), last menstrual period 03/23/2017, SpO2 100 %.  ECOG PERFORMANCE STATUS: 1 - Symptomatic but completely ambulatory  Physical Exam  Constitutional: She is  oriented to person, place, and time and well-developed, well-nourished, and in no distress. No distress.  HENT:  Head: Normocephalic and atraumatic.  Mouth/Throat: Oropharynx is clear and moist. No oropharyngeal exudate.  Eyes: Conjunctivae and EOM are normal. Pupils are equal, round, and reactive to light. No scleral icterus.  Neck: No thyromegaly present.  Cardiovascular: Normal rate, regular rhythm and normal heart sounds.  No murmur heard. Pulmonary/Chest: Breath sounds normal. No respiratory distress. She has no wheezes. She has no rales. She  exhibits no tenderness.  Abdominal: Soft. Bowel sounds are normal. She exhibits no distension and no mass. There is no tenderness. There is no rebound and no guarding.  Lymphadenopathy:    She has no cervical adenopathy.  Neurological: She is alert and oriented to person, place, and time. She has normal reflexes. No cranial nerve deficit.  Skin: Skin is warm and dry. No rash noted. She is not diaphoretic. No erythema.     LABORATORY DATA: I have personally reviewed the data as listed: Appointment on 04/01/2017  Component Date Value Ref Range Status  . Erythropoietin 04/01/2017 49.1* 2.6 - 18.5 mIU/mL Final   [employer] Coul[employer] UniCel DxI 800 Immunoassay System  . Haptoglobin 04/01/2017 97  34 - 200 mg/dL Final  . Vitamin B12 04/01/2017 231* 232 - 1,245 pg/mL Final  . Folate 04/01/2017 11.3  >3.0 ng/mL Final   Comment: A serum folate concentration of less than 3.1 ng/mL is considered to represent clinical deficiency.   . Iron 04/01/2017 278* 41 - 142 ug/dL Final  . TIBC 04/01/2017 365  236 - 444 ug/dL Final  . UIBC 04/01/2017 87* 120 - 384 ug/dL Final  . %SAT 04/01/2017 76* 21 - 57 % Final  . Ferritin 04/01/2017 5* 9 - 269 ng/ml Final  . LDH 04/01/2017 139  125 - 245 U/L Final  . Sodium 04/01/2017 139  136 - 145 mEq/L Final  . Potassium 04/01/2017 3.9  3.5 - 5.1 mEq/L Final  . Chloride 04/01/2017 106  98 - 109 mEq/L Final  . CO2 04/01/2017  26  22 - 29 mEq/L Final  . Glucose 04/01/2017 83  70 - 140 mg/dl Final   Glucose reference range is for nonfasting patients. Fasting glucose reference range is 70- 100.  Marland Kitchen BUN 04/01/2017 10.2  7.0 - 26.0 mg/dL Final  . Creatinine 04/01/2017 1.2* 0.6 - 1.1 mg/dL Final  . Total Bilirubin 04/01/2017 0.79  0.20 - 1.20 mg/dL Final  . Alkaline Phosphatase 04/01/2017 57  40 - 150 U/L Final  . AST 04/01/2017 21  5 - 34 U/L Final  . ALT 04/01/2017 13  0 - 55 U/L Final  . Total Protein 04/01/2017 7.2  6.4 - 8.3 g/dL Final  . Albumin 04/01/2017 3.8  3.5 - 5.0 g/dL Final  . Calcium 04/01/2017 8.8  8.4 - 10.4 mg/dL Final  . Anion Gap 04/01/2017 7  3 - 11 mEq/L Final  . EGFR 04/01/2017 >60  >60 ml/min/1.73 m2 Final   eGFR is calculated using the CKD-EPI Creatinine Equation (2009)  . Tear Drop Cells 04/01/2017 Few  Negative Final  . Ovalocytes 04/01/2017 Moderate  Negative Final  . Shistocytes 04/01/2017 Few  Negative Final  . Blasts 04/01/2017 1  0 - 0 % Final  . PLT EST 04/01/2017 265  Adequate Final  . Platelet Morphology 04/01/2017 large and giant plts seen  Within Normal Limits Final  . WBC 04/01/2017 4.0  3.9 - 10.3 10e3/uL Final  . NEUT# 04/01/2017 1.6  1.5 - 6.5 10e3/uL Final  . HGB 04/01/2017 8.7* 11.6 - 15.9 g/dL Final  . HCT 04/01/2017 28.7* 34.8 - 46.6 % Final  . Platelets 04/01/2017 275  145 - 400 10e3/uL Final  . MCV 04/01/2017 65.1* 79.5 - 101.0 fL Final  . MCH 04/01/2017 19.8* 25.1 - 34.0 pg Final  . MCHC 04/01/2017 30.4* 31.5 - 36.0 g/dL Final  . RBC 04/01/2017 4.40  3.70 - 5.45 10e6/uL Final  . RDW 04/01/2017 28.3* 11.2 - 14.5 % Final  .  lymph# 04/01/2017 1.4  0.9 - 3.3 10e3/uL Final  . MONO# 04/01/2017 0.4  0.1 - 0.9 10e3/uL Final  . Eosinophils Absolute 04/01/2017 0.4  0.0 - 0.5 10e3/uL Final  . Basophils Absolute 04/01/2017 0.1  0.0 - 0.1 10e3/uL Final  . NEUT% 04/01/2017 41.5  38.4 - 76.8 % Final  . LYMPH% 04/01/2017 35.0  14.0 - 49.7 % Final  . MONO% 04/01/2017 10.5  0.0  - 14.0 % Final  . EOS% 04/01/2017 11.2* 0.0 - 7.0 % Final  . BASO% 04/01/2017 1.8  0.0 - 2.0 % Final  . Retic % 04/01/2017 0.59* 0.70 - 2.10 % Final  . Retic Ct Abs 04/01/2017 25.96* 33.70 - 90.70 10e3/uL Final  . Immature Retic Fract 04/01/2017 8.90  1.60 - 10.00 % Final  Office Visit on 03/27/2017  Component Date Value Ref Range Status  . WBC 03/27/2017 4.1  3.8 - 10.8 Thousand/uL Final  . RBC 03/27/2017 4.21  3.80 - 5.10 Million/uL Final  . Hemoglobin 03/27/2017 8.2* 11.7 - 15.5 g/dL Final  . HCT 03/27/2017 28.1* 35.0 - 45.0 % Final  . MCV 03/27/2017 66.7* 80.0 - 100.0 fL Final  . MCH 03/27/2017 19.5* 27.0 - 33.0 pg Final  . MCHC 03/27/2017 29.2* 32.0 - 36.0 g/dL Final  . RDW 03/27/2017 24.9* 11.0 - 15.0 % Final  . Platelets 03/27/2017 279  140 - 400 Thousand/uL Final  . MPV 03/27/2017 CANCELED   Final   Comment:    * Test not performed.  *   Result not calculated because one or more required values exceed analytical limits.  Result canceled by the ancillary.   Marland Kitchen TSH 03/27/2017 0.94  mIU/L Final   Comment:           Reference Range .           > or = 20 Years  0.40-4.50 .                Pregnancy Ranges           First trimester    0.26-2.66           Second trimester   0.55-2.73           Third trimester    0.43-2.91          Ardath Sax, MD

## 2017-05-08 ENCOUNTER — Ambulatory Visit: Payer: 59

## 2017-05-12 NOTE — Progress Notes (Signed)
[facility] Follow-up Visit:  Assessment: Iron deficiency anemia due to chronic blood loss 41 y.o. female with iron deficiency anemia due to heavy menstrual bleeding with difficulties tolerating oral iron supplementation.  Currently, appears to be tolerating liquid oral iron supplement.  Repeat levels demonstrate improving iron levels, improving Hgb consistent with utilization of the iron by the bone marrow. Iron stores are still depleted and will take longer to replete. Additionally, patient appears to be Vit B12 deficient, likely due to combination of increased demand in the setting of compensatory erythropoiesis with iron levels recovery as well as possible inadequate PO intake. No evidence to suspect pernicious anemia at this time.  Plan: --Continue Fe supplementation as before --B12 IM injection QMo until repleted, check antiparietal Ab and anti-intrinsic factor Abs --RTC 2 months: labs 1 week prior for both B12 and Fe replacement, clinic visit    Orders Placed This Encounter  Procedures  . Methylmalonic acid, serum    Standing Status:   Future    Standing Expiration Date:   04/08/2018  . Homocysteine, serum    Standing Status:   Future    Standing Expiration Date:   04/08/2018  . Intrinsic factor antibodies    Standing Status:   Future    Standing Expiration Date:   04/08/2018  . Anti-parietal antibody    Standing Status:   Future    Standing Expiration Date:   04/08/2018  . CBC & Diff and Retic    Standing Status:   Future    Standing Expiration Date:   04/08/2018  . Lactate dehydrogenase (LDH)    Standing Status:   Future    Standing Expiration Date:   04/08/2018  . Comprehensive metabolic panel    Standing Status:   Future    Number of Occurrences:   1    Standing Expiration Date:   04/08/2018  . Ferritin    Standing Status:   Future    Number of Occurrences:   1    Standing Expiration Date:   04/08/2018  . Iron and TIBC    Standing Status:    Future    Number of Occurrences:   1    Standing Expiration Date:   04/08/2018  . Folate, Serum    Standing Status:   Future    Number of Occurrences:   1    Standing Expiration Date:   04/08/2018  . Vitamin B12    Standing Status:   Future    Number of Occurrences:   1    Standing Expiration Date:   04/08/2018  . Methylmalonic acid, serum    Standing Status:   Future    Number of Occurrences:   1    Standing Expiration Date:   04/08/2018   All questions were answered.  . The patient knows to call the clinic with any problems, questions or concerns.  This note was electronically signed.    History of Presenting Illness Rita Dean is a 41 y.o. female followed in the [facility] for deficiency anemia due to chronic blood loss, referred by Harrison Mons, PA-C.  Patient's past medical history significant for single kidney secondary to donation in 2011.  Patient reports long-term history of iron deficiency.  Her symptoms include smoothing of the tongue, pica with ice cravings, soreness of the tongue which has recently improved.  In the past, she has been tried on ferrous gluconate for a month and that caused severe constipation no improvement in energy level.  Presently, she is taking a plant-based ferrous supplement  [name]).  With the more reliable oral intake of iron, she appears to have improvement in her energy and resolution of the tongue discomfort.  Patient denies alcohol abuse or significant use.  She is not a smoker.  At the present time, patient is supplementing her iron using [employer] Plant Force Liquid Iron supplement. Patient had her last menstrual period last week. No new symptoms since the last clinic visit.  Oncological/hematological History: --Labs, 02/15/17: Hgb 7.4, MCV 59.0, MCH 17.1, Plt 429; Fe   11, FeSat   3%, TIBC 407, Ferritin 3; --Labs, 04/01/17: Hgb 8.7, MCV 65.1, MCH 19.8, Plt 275; Fe 278, FeSat 76%, TIBC    ..., Ferritin 5,  Folate 113, Vit B12 231    Medical History: Past Medical History:  Diagnosis Date  . Allergy   . Anemia     Surgical History: Past Surgical History:  Procedure Laterality Date  . NEPHRECTOMY TRANSPLANTED ORGAN  2011   donated to her mother    Family History: Family History  Problem Relation Age of Onset  . Diabetes Mother   . Hypertension Mother   . Seizures Sister   . Asthma Son   . Diabetes Maternal Grandmother   . Stroke Maternal Grandmother   . Cancer Maternal Grandfather        prostate    Social History: Social History   Socioeconomic History  . Marital status: Married    Spouse name: Shawn  . Number of children: 3  . Years of education: Master's  . Highest education level: Not on file  Social Needs  . Financial resource strain: Not on file  . Food insecurity - worry: Not on file  . Food insecurity - inability: Not on file  . Transportation needs - medical: Not on file  . Transportation needs - non-medical: Not on file  Occupational History  . Occupation: Solicitor    Comment: Engineering/IT company  Tobacco Use  . Smoking status: Never Smoker  . Smokeless tobacco: Never Used  Substance and Sexual Activity  . Alcohol use: No    Alcohol/week: 0.0 oz  . Drug use: No  . Sexual activity: Yes    Partners: Male    Birth control/protection: None    Comment: 1ST intercourse- 65, partners- 2, married- 22 yrs   Other Topics Concern  . Not on file  Social History Narrative   Lives with her husband and their three children.   Work - Sports administrator   Exercise - 2-3 times per week   Diet - healthy   Donated kidney to mom in 2011    Allergies: Allergies  Allergen Reactions  . Shellfish Allergy Rash    Medications:  Current Outpatient Medications  Medication Sig Dispense Refill  . IRON PO Take by mouth.    . norethindrone (MICRONOR,CAMILA,ERRIN) 0.35 MG tablet Take 1 tablet (0.35 mg total) daily by mouth. 3 Package 3   No  current facility-administered medications for this visit.     Review of Systems: Review of Systems  Constitutional: Positive for fatigue.  All other systems reviewed and are negative.    PHYSICAL EXAMINATION Blood pressure 105/64, pulse (!) 104, temperature 98.8 F (37.1 C), temperature source Oral, resp. rate 19, height '5\' 6"'  (1.676 m), weight 140 lb 8 oz (63.7 kg), last menstrual period 03/23/2017, SpO2 100 %.  ECOG PERFORMANCE STATUS: 2 - Symptomatic, <50% confined to bed  Physical Exam  Constitutional: She is oriented to person, place, and time and well-developed, well-nourished, and in no distress. No distress.  HENT:  Head: Normocephalic and atraumatic.  Mouth/Throat: Oropharynx is clear and moist. No oropharyngeal exudate.  Eyes: Conjunctivae and EOM are normal. Pupils are equal, round, and reactive to light. No scleral icterus.  Neck: No thyromegaly present.  Cardiovascular: Normal rate, regular rhythm and normal heart sounds.  No murmur heard. Pulmonary/Chest: Breath sounds normal. No respiratory distress. She has no wheezes. She has no rales. She exhibits no tenderness.  Abdominal: Soft. Bowel sounds are normal. She exhibits no distension and no mass. There is no tenderness. There is no rebound and no guarding.  Lymphadenopathy:    She has no cervical adenopathy.  Neurological: She is alert and oriented to person, place, and time. She has normal reflexes. No cranial nerve deficit.  Skin: Skin is warm and dry. No rash noted. She is not diaphoretic. No erythema.     LABORATORY DATA: I have personally reviewed the data as listed: Appointment on 04/08/2017  Component Date Value Ref Range Status  . Methylmalonic Acid, Serum 04/08/2017 228  0 - 378 nmol/L Final  . Disclaimer: 04/08/2017 Comment   Final   Comment: This test was developed and its performance characteristics determined by [facility]. It has not been cleared or approved by the Food and Drug Administration.   .  Folate 04/08/2017 12.6  >3.0 ng/mL Final   Comment: A serum folate concentration of less than 3.1 ng/mL is considered to represent clinical deficiency.   . Vitamin B12 04/08/2017 212* 232 - 1,245 pg/mL Final  . Iron 04/08/2017 86  41 - 142 ug/dL Final  . TIBC 04/08/2017 364  236 - 444 ug/dL Final  . UIBC 04/08/2017 277  120 - 384 ug/dL Final  . %SAT 04/08/2017 24  21 - 57 % Final  . Ferritin 04/08/2017 11  9 - 269 ng/ml Final  . Sodium 04/08/2017 138  136 - 145 mEq/L Final  . Potassium 04/08/2017 4.0  3.5 - 5.1 mEq/L Final  . Chloride 04/08/2017 107  98 - 109 mEq/L Final  . CO2 04/08/2017 25  22 - 29 mEq/L Final  . Glucose 04/08/2017 91  70 - 140 mg/dl Final   Glucose reference range is for nonfasting patients. Fasting glucose reference range is 70- 100.  Marland Kitchen BUN 04/08/2017 10.3  7.0 - 26.0 mg/dL Final  . Creatinine 04/08/2017 1.2* 0.6 - 1.1 mg/dL Final  . Total Bilirubin 04/08/2017 0.82  0.20 - 1.20 mg/dL Final  . Alkaline Phosphatase 04/08/2017 59  40 - 150 U/L Final  . AST 04/08/2017 24  5 - 34 U/L Final  . ALT 04/08/2017 19  0 - 55 U/L Final  . Total Protein 04/08/2017 7.4  6.4 - 8.3 g/dL Final  . Albumin 04/08/2017 3.8  3.5 - 5.0 g/dL Final  . Calcium 04/08/2017 9.2  8.4 - 10.4 mg/dL Final  . Anion Gap 04/08/2017 7  3 - 11 mEq/L Final  . EGFR 04/08/2017 >60  >60 ml/min/1.73 m2 Final   eGFR is calculated using the CKD-EPI Creatinine Equation (2009)       Ardath Sax, MD

## 2017-05-12 NOTE — Assessment & Plan Note (Signed)
41 y.o. female with iron deficiency anemia due to heavy menstrual bleeding with difficulties tolerating oral iron supplementation.  Currently, appears to be tolerating liquid oral iron supplement.  Repeat levels demonstrate improving iron levels, improving Hgb consistent with utilization of the iron by the bone marrow. Iron stores are still depleted and will take longer to replete. Additionally, patient appears to be Vit B12 deficient, likely due to combination of increased demand in the setting of compensatory erythropoiesis with iron levels recovery as well as possible inadequate PO intake. No evidence to suspect pernicious anemia at this time.  Plan: --Continue Fe supplementation as before --B12 IM injection QMo until repleted, check antiparietal Ab and anti-intrinsic factor Abs --RTC 2 months: labs 1 week prior for both B12 and Fe replacement, clinic visit

## 2017-06-02 ENCOUNTER — Inpatient Hospital Stay: Payer: 59 | Attending: Hematology and Oncology

## 2017-06-02 DIAGNOSIS — D510 Vitamin B12 deficiency anemia due to intrinsic factor deficiency: Secondary | ICD-10-CM | POA: Insufficient documentation

## 2017-06-02 DIAGNOSIS — Z905 Acquired absence of kidney: Secondary | ICD-10-CM | POA: Diagnosis not present

## 2017-06-02 DIAGNOSIS — D519 Vitamin B12 deficiency anemia, unspecified: Secondary | ICD-10-CM

## 2017-06-02 DIAGNOSIS — Z8042 Family history of malignant neoplasm of prostate: Secondary | ICD-10-CM | POA: Insufficient documentation

## 2017-06-02 DIAGNOSIS — D500 Iron deficiency anemia secondary to blood loss (chronic): Secondary | ICD-10-CM | POA: Insufficient documentation

## 2017-06-02 LAB — CBC WITH DIFFERENTIAL/PLATELET
BASOS ABS: 0 10*3/uL (ref 0.0–0.1)
BASOS PCT: 1 %
EOS ABS: 0.4 10*3/uL (ref 0.0–0.5)
Eosinophils Relative: 9 %
HCT: 32 % — ABNORMAL LOW (ref 34.8–46.6)
Hemoglobin: 9.6 g/dL — ABNORMAL LOW (ref 11.6–15.9)
Lymphocytes Relative: 26 %
Lymphs Abs: 1.1 10*3/uL (ref 0.9–3.3)
MCH: 22.1 pg — ABNORMAL LOW (ref 25.1–34.0)
MCHC: 30 g/dL — ABNORMAL LOW (ref 31.5–36.0)
MCV: 73.7 fL — ABNORMAL LOW (ref 79.5–101.0)
MONO ABS: 0.4 10*3/uL (ref 0.1–0.9)
Monocytes Relative: 9 %
NEUTROS ABS: 2.2 10*3/uL (ref 1.5–6.5)
Neutrophils Relative %: 55 %
PLATELETS: 264 10*3/uL (ref 145–400)
RBC: 4.34 MIL/uL (ref 3.70–5.45)
RDW: 19.8 % — AB (ref 11.2–16.1)
WBC: 4.1 10*3/uL (ref 3.9–10.3)

## 2017-06-02 LAB — LACTATE DEHYDROGENASE: LDH: 140 U/L (ref 125–245)

## 2017-06-02 LAB — RETICULOCYTES
RBC.: 4.34 MIL/uL (ref 3.70–5.45)
RETIC COUNT ABSOLUTE: 26 10*3/uL — AB (ref 33.7–90.7)
RETIC CT PCT: 0.6 % — AB (ref 0.7–2.1)

## 2017-06-03 ENCOUNTER — Other Ambulatory Visit: Payer: 59

## 2017-06-03 LAB — INTRINSIC FACTOR ANTIBODIES: Intrinsic Factor: 1 AU/mL (ref 0.0–1.1)

## 2017-06-03 LAB — HOMOCYSTEINE: HOMOCYSTEINE-NORM: 7.6 umol/L (ref 0.0–15.0)

## 2017-06-03 LAB — ANTI-PARIETAL ANTIBODY: PARIETAL CELL ANTIBODY-IGG: 38.7 U — AB (ref 0.0–20.0)

## 2017-06-05 ENCOUNTER — Encounter: Payer: Self-pay | Admitting: Hematology and Oncology

## 2017-06-05 ENCOUNTER — Encounter: Payer: Self-pay | Admitting: Nurse Practitioner

## 2017-06-05 ENCOUNTER — Telehealth: Payer: Self-pay | Admitting: Hematology and Oncology

## 2017-06-05 ENCOUNTER — Inpatient Hospital Stay (HOSPITAL_BASED_OUTPATIENT_CLINIC_OR_DEPARTMENT_OTHER): Payer: 59 | Admitting: Hematology and Oncology

## 2017-06-05 VITALS — BP 107/53 | HR 80 | Temp 99.3°F | Resp 16 | Wt 141.3 lb

## 2017-06-05 DIAGNOSIS — Z8052 Family history of malignant neoplasm of bladder: Secondary | ICD-10-CM | POA: Diagnosis not present

## 2017-06-05 DIAGNOSIS — D500 Iron deficiency anemia secondary to blood loss (chronic): Secondary | ICD-10-CM

## 2017-06-05 DIAGNOSIS — D510 Vitamin B12 deficiency anemia due to intrinsic factor deficiency: Secondary | ICD-10-CM

## 2017-06-05 DIAGNOSIS — Z905 Acquired absence of kidney: Secondary | ICD-10-CM | POA: Diagnosis not present

## 2017-06-05 LAB — METHYLMALONIC ACID, SERUM: Methylmalonic Acid, Quantitative: 151 nmol/L (ref 0–378)

## 2017-06-05 NOTE — Progress Notes (Signed)
[facility] Visit:  Assessment: Iron deficiency anemia due to chronic blood loss 42 y.o. female with iron deficiency anemia due to heavy menstrual bleeding with difficulties tolerating oral iron supplementation.  Currently, appears to be tolerating liquid oral iron supplement.  Repeat levels demonstrate improving iron levels, improving Hgb consistent with utilization of the iron by the bone marrow. Iron stores are still depleted and will take longer to replete.  Testing for positive for elevated levels of anti-parietal cell antibodies, suggesting presence of pernicious anemia.  Plan: --Continue Fe supplementation as before  Pernicious anemia In the context of elevated pernicious anemia suspected due to discovery of decreased B12 level in the setting of positive antiparietal cell antibodies.  Additional evaluation for gastropathy and possible gastric malignancy needs to be undertaken.  Plan: -Consult gastroenterology for possible EGD to evaluate for gastropathy  Return to clinic in 1 month with lab work 2-3 days in advance including B12 level, folate, and iron panel.  Voice recognition software was used and creation of this note. Despite my best effort at editing the text, some misspelling/errors may have occurred.   Orders Placed This Encounter  Procedures  . CBC with Differential (Cancer Center Only)    Standing Status:   Future    Standing Expiration Date:   06/05/2018  . CMP ([facility] only)    Standing Status:   Future    Standing Expiration Date:   06/05/2018  . Lactate dehydrogenase (LDH)    Standing Status:   Future    Standing Expiration Date:   06/05/2018  . Ferritin    Standing Status:   Future    Standing Expiration Date:   06/05/2018  . Iron and TIBC    Standing Status:   Future    Standing Expiration Date:   06/05/2018  . Vitamin B12    Standing Status:   Future    Standing Expiration Date:   06/05/2018  . Methylmalonic acid, serum   Standing Status:   Future    Standing Expiration Date:   06/05/2018  . Ambulatory referral to Gastroenterology    Referral Priority:   Routine    Referral Type:   Consultation    Referral Reason:   Specialty Services Required    Number of Visits Requested:   1   All questions were answered.  . The patient knows to call the clinic with any problems, questions or concerns.  This note was electronically signed.    History of Presenting Illness Rita Dean is a 41 y.o. female followed in the [facility] for deficiency anemia due to chronic blood loss, referred by Harrison Mons, PA-C.  Patient's past medical history significant for single kidney secondary to donation in 2011.  Patient reports long-term history of iron deficiency.  Her symptoms include smoothing of the tongue, pica with ice cravings, soreness of the tongue which has recently improved.  In the past, she has been tried on ferrous gluconate for a month and that caused severe constipation no improvement in energy level.  Presently, she is taking a plant-based ferrous supplement  [name]).  With the more reliable oral intake of iron, she appears to have improvement in her energy and resolution of the tongue discomfort.  Patient denies alcohol abuse or significant use.  She is not a smoker.  At the present time, patient is supplementing her iron using [employer] Her[employer] Plant Force Liquid Iron supplement.  At last visit, patient was advised to  start the B12 injections due to discovered B12 deficiency.  She did not take injections, but instead started oral supplementation with liquid supplement.  Overall, patient reports gradual and slight improvement in the fatigue.  No new symptoms since the last clinic visit.  Oncological/hematological History: --Labs, 02/15/17: Hgb 7.4, MCV 59.0, MCH 17.1, Plt 429; Fe   11, FeSat   3%, TIBC 407, Ferritin 3; --Labs, 04/01/17: Hgb 8.7, MCV 65.1, MCH 19.8, Plt 275; Fe 278, FeSat 76%,  TIBC    ..., Ferritin 5, Folate 113, Vit B12 231, MMA 228  --Labs, 06/02/17: Hgb 9.6, MCV 73.7, MCH 22.1, Plt 264; Fe               MMA 151, Homocysteine 7.6; Intrinsic factor 1.0, anti-Parietal cell IgG 38.7(up)   Medical History: Past Medical History:  Diagnosis Date  . Allergy   . Anemia     Surgical History: Past Surgical History:  Procedure Laterality Date  . NEPHRECTOMY TRANSPLANTED ORGAN  2011   donated to her mother    Family History: Family History  Problem Relation Age of Onset  . Diabetes Mother   . Hypertension Mother   . Seizures Sister   . Asthma Son   . Diabetes Maternal Grandmother   . Stroke Maternal Grandmother   . Cancer Maternal Grandfather        prostate    Social History: Social History   Socioeconomic History  . Marital status: Married    Spouse name: Shawn  . Number of children: 3  . Years of education: Master's  . Highest education level: Not on file  Social Needs  . Financial resource strain: Not on file  . Food insecurity - worry: Not on file  . Food insecurity - inability: Not on file  . Transportation needs - medical: Not on file  . Transportation needs - non-medical: Not on file  Occupational History  . Occupation: Solicitor    Comment: Engineering/IT company  Tobacco Use  . Smoking status: Never Smoker  . Smokeless tobacco: Never Used  Substance and Sexual Activity  . Alcohol use: No    Alcohol/week: 0.0 oz  . Drug use: No  . Sexual activity: Yes    Partners: Male    Birth control/protection: None    Comment: 1ST intercourse- 16, partners- 2, married- 61 yrs   Other Topics Concern  . Not on file  Social History Narrative   Lives with her husband and their three children.   Work - Sports administrator   Exercise - 2-3 times per week   Diet - healthy   Donated kidney to mom in 2011    Allergies: Allergies  Allergen Reactions  . Shellfish Allergy Rash    Medications:  Current Outpatient Medications   Medication Sig Dispense Refill  . IRON PO Take by mouth.    . norethindrone (MICRONOR,CAMILA,ERRIN) 0.35 MG tablet Take 1 tablet (0.35 mg total) daily by mouth. (Patient not taking: Reported on 06/05/2017) 3 Package 3   No current facility-administered medications for this visit.     Review of Systems: Review of Systems  Constitutional: Positive for fatigue.  All other systems reviewed and are negative.    PHYSICAL EXAMINATION Blood pressure (!) 107/53, pulse 80, temperature 99.3 F (37.4 C), temperature source Oral, resp. rate 16, weight 141 lb 4.8 oz (64.1 kg), SpO2 100 %.  ECOG PERFORMANCE STATUS: 2 - Symptomatic, <50% confined to bed  Physical Exam  Constitutional: She is oriented to person,  place, and time and well-developed, well-nourished, and in no distress. No distress.  HENT:  Head: Normocephalic and atraumatic.  Mouth/Throat: Oropharynx is clear and moist. No oropharyngeal exudate.  Eyes: Conjunctivae and EOM are normal. Pupils are equal, round, and reactive to light. No scleral icterus.  Neck: No thyromegaly present.  Cardiovascular: Normal rate, regular rhythm and normal heart sounds.  No murmur heard. Pulmonary/Chest: Breath sounds normal. No respiratory distress. She has no wheezes. She has no rales. She exhibits no tenderness.  Abdominal: Soft. Bowel sounds are normal. She exhibits no distension and no mass. There is no tenderness. There is no rebound and no guarding.  Lymphadenopathy:    She has no cervical adenopathy.  Neurological: She is alert and oriented to person, place, and time. She has normal reflexes. No cranial nerve deficit.  Skin: Skin is warm and dry. No rash noted. She is not diaphoretic. No erythema.     LABORATORY DATA: I have personally reviewed the data as listed: Appointment on 06/02/2017  Component Date Value Ref Range Status  . LDH 06/02/2017 140  125 - 245 U/L Final   Performed at [facility], [place] [address], [place], [place] [address]  . Parietal Cell Antibody-IgG 06/02/2017 38.7* 0.0 - 20.0 Units Final   Comment: (NOTE)                                Negative    0.0 - 20.0                                Equivocal  20.1 - 24.9                                Positive         >24.9 Parietal Cell Antibodies are found in 90% of patients with pernicious anemia and 30% of first degree relatives with pernicious anemia. Performed At: [facility] [place], [place] [id number] Rush Farmer MD [id number] Performed at [facility], [place] [address], [place], [place] [address]   . Intrinsic Factor 06/02/2017 1.0  0.0 - 1.1 AU/mL Final   Comment: (NOTE) Performed At: [facility] [place], [place] [id number] Rush Farmer MD [id number] Performed at [facility], [place] [address], [place], [place] [address]   . Homocysteine 06/02/2017 7.6  0.0 - 15.0 umol/L Final   Comment: (NOTE) Performed At: [facility] [place], [place] [id number] Rush Farmer MD [id number] Performed at [facility], [place] [address], [place], [place] [address]   . Methylmalonic Acid, Quantitative 06/02/2017 151  0 - 378 nmol/L Final  . Disclaimer: 06/02/2017 Comment   Final   Comment: (NOTE) This test was developed and its performance characteristics determined by [facility]. It has not been cleared or approved by the Food and Drug Administration. Performed At: [facility] [place], [place] [id number] Rush Farmer MD [id number] Performed at [facility], [place] [address], [place], [place] [address]   . WBC 06/02/2017 4.1  3.9 - 10.3 K/uL Final  . RBC 06/02/2017 4.34  3.70 - 5.45 MIL/uL Final  . Hemoglobin 06/02/2017 9.6* 11.6 - 15.9 g/dL Final  . HCT 06/02/2017 32.0* 34.8 - 46.6 %  Final  . MCV 06/02/2017 73.7* 79.5 - 101.0  fL Final  . MCH 06/02/2017 22.1* 25.1 - 34.0 pg Final  . MCHC 06/02/2017 30.0* 31.5 - 36.0 g/dL Final  . RDW 06/02/2017 19.8* 11.2 - 16.1 % Final  . Platelets 06/02/2017 264  145 - 400 K/uL Final  . Neutrophils Relative % 06/02/2017 55  % Final  . Neutro Abs 06/02/2017 2.2  1.5 - 6.5 K/uL Final  . Lymphocytes Relative 06/02/2017 26  % Final  . Lymphs Abs 06/02/2017 1.1  0.9 - 3.3 K/uL Final  . Monocytes Relative 06/02/2017 9  % Final  . Monocytes Absolute 06/02/2017 0.4  0.1 - 0.9 K/uL Final  . Eosinophils Relative 06/02/2017 9  % Final  . Eosinophils Absolute 06/02/2017 0.4  0.0 - 0.5 K/uL Final  . Basophils Relative 06/02/2017 1  % Final  . Basophils Absolute 06/02/2017 0.0  0.0 - 0.1 K/uL Final   Performed at [facility], [place] [address] [place], [place] [address]  . Retic Ct Pct 06/02/2017 0.6* 0.7 - 2.1 % Final  . RBC. 06/02/2017 4.34  3.70 - 5.45 MIL/uL Final  . Retic Count, Absolute 06/02/2017 26.0* 33.7 - 90.7 K/uL Final   Performed at [facility], [place] [address], [place], [place] [address]       Ardath Sax, MD

## 2017-06-05 NOTE — Assessment & Plan Note (Signed)
42 y.o. female with iron deficiency anemia due to heavy menstrual bleeding with difficulties tolerating oral iron supplementation.  Currently, appears to be tolerating liquid oral iron supplement.  Repeat levels demonstrate improving iron levels, improving Hgb consistent with utilization of the iron by the bone marrow. Iron stores are still depleted and will take longer to replete.  Testing for positive for elevated levels of anti-parietal cell antibodies, suggesting presence of pernicious anemia.  Plan: --Continue Fe supplementation as before

## 2017-06-05 NOTE — Telephone Encounter (Signed)
Gave patient avs report and appointments for March. GI referral routed to [facility] - office will call patient - patient aware.

## 2017-06-05 NOTE — Assessment & Plan Note (Signed)
In the context of elevated pernicious anemia suspected due to discovery of decreased B12 level in the setting of positive antiparietal cell antibodies.  Additional evaluation for gastropathy and possible gastric malignancy needs to be undertaken.  Plan: -Consult gastroenterology for possible EGD to evaluate for gastropathy

## 2017-06-09 ENCOUNTER — Ambulatory Visit: Payer: 59

## 2017-06-17 ENCOUNTER — Ambulatory Visit: Payer: 59 | Admitting: Nurse Practitioner

## 2017-06-17 ENCOUNTER — Encounter: Payer: Self-pay | Admitting: Gastroenterology

## 2017-06-17 ENCOUNTER — Encounter: Payer: Self-pay | Admitting: Nurse Practitioner

## 2017-06-17 VITALS — BP 90/60 | HR 88 | Ht 66.0 in | Wt 142.2 lb

## 2017-06-17 DIAGNOSIS — D500 Iron deficiency anemia secondary to blood loss (chronic): Secondary | ICD-10-CM

## 2017-06-17 DIAGNOSIS — E538 Deficiency of other specified B group vitamins: Secondary | ICD-10-CM

## 2017-06-17 NOTE — Patient Instructions (Signed)
If you are age 42 or older, your body mass index should be between 23-30. Your Body mass index is 22.95 kg/m. If this is out of the aforementioned range listed, please consider follow up with your Primary Care Provider.  If you are age 28 or younger, your body mass index should be between 19-25. Your Body mass index is 22.95 kg/m. If this is out of the aformentioned range listed, please consider follow up with your Primary Care Provider.   You have been scheduled for an endoscopy. Please follow written instructions given to you at your visit today. If you use inhalers (even only as needed), please bring them with you on the day of your procedure. Your physician has requested that you go to www.startemmi.com and enter the access code given to you at your visit today. This web site gives a general overview about your procedure. However, you should still follow specific instructions given to you by our office regarding your preparation for the procedure.  Thank you for choosing me and [facility].   Tye Savoy, NP

## 2017-06-17 NOTE — Progress Notes (Signed)
I agree with the above note, plan 

## 2017-06-17 NOTE — Progress Notes (Signed)
     Chief Complaint:  None - sent for evaluation of pernicious anemia  Referring Provider:   Dr. Grace Isaac and Windell Hummingbird, P.A  ASSESSMENT AND PLAN;   1. 42 yo female with B12 deficiency, positive antiparietal cell ab. Referred by Hematology.  -Patient needs EGD to evaluate for atrophic gastritis / H.pylori infection. The risks and benefits of EGD were discussed and the patient agrees to proceed.    2. Iron deficiency anemia secondary to heavy menses. Ferritin 11. Iron replacement per Hematologist  3. Hx of nephrectomy, donated kidney to mother     HPI:    42 yo healthy female referred for recently diagnosed B12 deficiency. She also has iron deficiency secondary to heavy menses. Couldn't tolerate iron tablets due to constipation.  No [place] of GI diseases/ cancers. No significant use of NSAIDS. No GI complaints. Specifically no abdominal pain, diarrhea, overt GI bleeding, or constipation other than that related to iron. She feels okay except for fatigue. No chest pain or SOB.     Past Medical History:  Diagnosis Date  . Allergy   . Pernicious anemia      Past Surgical History:  Procedure Laterality Date  . NEPHRECTOMY TRANSPLANTED ORGAN  2011   donated to her mother   Family History  Problem Relation Age of Onset  . Diabetes Mother   . Hypertension Mother   . Kidney disease Mother   . Seizures Sister   . Asthma Son        childhood  . Diabetes Maternal Grandmother   . Stroke Maternal Grandmother   . Prostate cancer Maternal Grandfather   . Colon cancer Neg Hx   . Esophageal cancer Neg Hx   . Liver disease Neg Hx    Social History   Tobacco Use  . Smoking status: Never Smoker  . Smokeless tobacco: Never Used  Substance Use Topics  . Alcohol use: No    Alcohol/week: 0.0 oz  . Drug use: No   Current Outpatient Medications  Medication Sig Dispense Refill  . IRON PO Take 20 mLs by mouth daily.      No current facility-administered medications for this  visit.    Allergies  Allergen Reactions  . Shellfish Allergy Rash    Review of Systems: Positive for fatigue. All other systems reviewed and negative except where noted in HPI.    Physical Exam:    Ht 5\' 6"  (1.676 m)   Wt 142 lb 3.2 oz (64.5 kg)   BMI 22.95 kg/m  Constitutional:  Well-developed, black female in no acute distress. Psychiatric: Normal mood and affect. Behavior is normal. EENT: Pupils normal.  Conjunctivae are normal. No scleral icterus. Neck supple.  Cardiovascular: Normal rate, regular rhythm. No edema Pulmonary/chest: Effort normal and breath sounds normal. No wheezing, rales or rhonchi. Abdominal: Soft, nondistended. Nontender. Bowel sounds active throughout. There are no masses palpable. No hepatomegaly. Neurological: Alert and oriented to person place and time. Skin: Skin is warm and dry. No rashes noted.  Tye Savoy, NP  06/17/2017, 8:45 AM  Cc: Ardath Sax, MD and Windell Hummingbird, P.A.

## 2017-06-30 ENCOUNTER — Encounter: Payer: Self-pay | Admitting: Gastroenterology

## 2017-06-30 ENCOUNTER — Ambulatory Visit (AMBULATORY_SURGERY_CENTER): Payer: 59 | Admitting: Gastroenterology

## 2017-06-30 VITALS — BP 99/61 | HR 76 | Temp 98.4°F | Resp 19 | Ht 66.0 in | Wt 142.0 lb

## 2017-06-30 DIAGNOSIS — K2970 Gastritis, unspecified, without bleeding: Secondary | ICD-10-CM | POA: Diagnosis not present

## 2017-06-30 DIAGNOSIS — D509 Iron deficiency anemia, unspecified: Secondary | ICD-10-CM | POA: Diagnosis not present

## 2017-06-30 DIAGNOSIS — D500 Iron deficiency anemia secondary to blood loss (chronic): Secondary | ICD-10-CM

## 2017-06-30 MED ORDER — SODIUM CHLORIDE 0.9 % IV SOLN
500.0000 mL | Freq: Once | INTRAVENOUS | Status: DC
Start: 2017-06-30 — End: 2017-07-01

## 2017-06-30 NOTE — Op Note (Signed)
[facility] Patient Name: Rita Dean Procedure Date: 06/30/2017 3:58 PM MRN: [id number] Endoscopist: Milus Banister , MD Age: 42 Referring MD:  Date of Birth: 1976/04/29 Gender: Female Account #: [id number] Procedure:                Upper GI endoscopy Indications:              B 12 deficiency, also severe iron def anemia from                            very heavy menstral cycles Medicines:                Monitored Anesthesia Care Procedure:                Pre-Anesthesia Assessment:                           - Prior to the procedure, a History and Physical                            was performed, and patient medications and                            allergies were reviewed. The patient's tolerance of                            previous anesthesia was also reviewed. The risks                            and benefits of the procedure and the sedation                            options and risks were discussed with the patient.                            All questions were answered, and informed consent                            was obtained. Prior Anticoagulants: The patient has                            taken no previous anticoagulant or antiplatelet                            agents. ASA Grade Assessment: II - A patient with                            mild systemic disease. After reviewing the risks                            and benefits, the patient was deemed in                            satisfactory condition to undergo the procedure.  After obtaining informed consent, the endoscope was                            passed under direct vision. Throughout the                            procedure, the patient's blood pressure, pulse, and                            oxygen saturations were monitored continuously. The                            Model GIF-HQ190 [id number]) scope was introduced                            through the mouth, and  advanced to the second part                            of duodenum. The upper GI endoscopy was                            accomplished without difficulty. The patient                            tolerated the procedure well. Scope In: Scope Out: Findings:                 Minimal inflammation characterized by erythema was                            found in the gastric antrum. Biopsies were taken                            with a cold forceps for histology.                           The exam was otherwise without abnormality. Complications:            No immediate complications. Estimated blood loss:                            None. Estimated Blood Loss:     Estimated blood loss: none. Impression:               - Mild gastritis. Biopsied.                           - The examination was otherwise normal. Recommendation:           - Patient has a contact number available for                            emergencies. The signs and symptoms of potential                            delayed complications were discussed with the  patient. Return to normal activities tomorrow.                            Written discharge instructions were provided to the                            patient.                           - Resume previous diet.                           - Continue present medications.                           - Await pathology results. Milus Banister, MD 06/30/2017 4:08:57 PM This report has been signed electronically.

## 2017-06-30 NOTE — Patient Instructions (Signed)
   Information on gastritis given to you today  Await biopsy results in a letter from Dr .Ardis Hughs   YOU HAD AN ENDOSCOPIC PROCEDURE TODAY AT THE [facility]:   Refer to the procedure report that was given to you for any specific questions about what was found during the examination.  If the procedure report does not answer your questions, please call your gastroenterologist to clarify.  If you requested that your care partner not be given the details of your procedure findings, then the procedure report has been included in a sealed envelope for you to review at your convenience later.  YOU SHOULD EXPECT: Some feelings of bloating in the abdomen. Passage of more gas than usual.  Walking can help get rid of the air that was put into your GI tract during the procedure and reduce the bloating. If you had a lower endoscopy (such as a colonoscopy or flexible sigmoidoscopy) you may notice spotting of blood in your stool or on the toilet paper. If you underwent a bowel prep for your procedure, you may not have a normal bowel movement for a few days.  Please Note:  You might notice some irritation and congestion in your nose or some drainage.  This is from the oxygen used during your procedure.  There is no need for concern and it should clear up in a day or so.  SYMPTOMS TO REPORT IMMEDIATELY:     Following upper endoscopy (EGD)  Vomiting of blood or coffee ground material  New chest pain or pain under the shoulder blades  Painful or persistently difficult swallowing  New shortness of breath  Fever of 100F or higher  Black, tarry-looking stools  For urgent or emergent issues, a gastroenterologist can be reached at any hour by calling [phone].   DIET:  We do recommend a small meal at first, but then you may proceed to your regular diet.  Drink plenty of fluids but you should avoid alcoholic beverages for 24 hours.  ACTIVITY:  You should plan to take it easy for the rest of  today and you should NOT DRIVE or use heavy machinery until tomorrow (because of the sedation medicines used during the test).    FOLLOW UP: Our staff will call the number listed on your records the next business day following your procedure to check on you and address any questions or concerns that you may have regarding the information given to you following your procedure. If we do not reach you, we will leave a message.  However, if you are feeling well and you are not experiencing any problems, there is no need to return our call.  We will assume that you have returned to your regular daily activities without incident.  If any biopsies were taken you will be contacted by phone or by letter within the next 1-3 weeks.  Please call us at [phone] if you have not heard about the biopsies in 3 weeks.    SIGNATURES/CONFIDENTIALITY: You and/or your care partner have signed paperwork which will be entered into your electronic medical record.  These signatures attest to the fact that that the information above on your After Visit Summary has been reviewed and is understood.  Full responsibility of the confidentiality of this discharge information lies with you and/or your care-partner.

## 2017-06-30 NOTE — Progress Notes (Signed)
To recovery, report to RN, VSS. 

## 2017-06-30 NOTE — Progress Notes (Signed)
Pt's states no medical or surgical changes since previsit or office visit. 

## 2017-07-01 ENCOUNTER — Telehealth: Payer: Self-pay | Admitting: *Deleted

## 2017-07-01 ENCOUNTER — Telehealth: Payer: Self-pay

## 2017-07-01 ENCOUNTER — Encounter: Payer: Self-pay | Admitting: Obstetrics & Gynecology

## 2017-07-01 ENCOUNTER — Ambulatory Visit: Payer: 59 | Admitting: Obstetrics & Gynecology

## 2017-07-01 VITALS — BP 120/82

## 2017-07-01 DIAGNOSIS — D259 Leiomyoma of uterus, unspecified: Secondary | ICD-10-CM | POA: Diagnosis not present

## 2017-07-01 DIAGNOSIS — N921 Excessive and frequent menstruation with irregular cycle: Secondary | ICD-10-CM

## 2017-07-01 DIAGNOSIS — D649 Anemia, unspecified: Secondary | ICD-10-CM | POA: Diagnosis not present

## 2017-07-01 NOTE — Telephone Encounter (Signed)
NO ANSWER, MESSAGE LEFT FOR PATIENT. 

## 2017-07-01 NOTE — Telephone Encounter (Signed)
  Follow up Call-  Call back number 06/30/2017  Post procedure Call Back phone  # [phone]  Permission to leave phone message Yes  Some recent data might be hidden     Patient questions:  Do you have a fever, pain , or abdominal swelling? No. Pain Score  0 *  Have you tolerated food without any problems? Yes.    Have you been able to return to your normal activities? Yes.    Do you have any questions about your discharge instructions: Diet   No. Medications  No. Follow up visit  No.  Do you have questions or concerns about your Care? No.  Actions: * If pain score is 4 or above: No action needed, pain <4.

## 2017-07-01 NOTE — Progress Notes (Signed)
    Rita Dean 1975-07-29 [id number]        42 y.o.  G4P4   RP: Worsening Menometrorrhagia   HPI: Pelvic US 06/2009 showed a probable SM Fibroid 2.5 cm, no further testing or procedure done.  Heavy periods with anemia, now lasting x 17 days.  Under care of Hematology, on liquid iron and Vit B12.  Did a GE work-up which was negative.  Didn't start Progestin-only pill yet, because worried of side-effects.  No pelvic pain.  No abnormal vaginal discharge.   OB History  Gravida Para Term Preterm AB Living  4 4       3   SAB TAB Ectopic Multiple Live Births               # Outcome Date GA Lbr Len/2nd Weight Sex Delivery Anes PTL Lv  4 Para           3 Para           2 Para           1 Para               Past medical history,surgical history, problem list, medications, allergies, family history and social history were all reviewed and documented in the EPIC chart.   Directed ROS with pertinent positives and negatives documented in the history of present illness/assessment and plan.  Exam:  Vitals:   07/01/17 1508  BP: 120/82   General appearance:  Normal  Abdomen: Normal  Gynecologic exam: Palpable uterine fibroid with enlarged uterus/nodular about 10 cm, AV, mobile.  No adnexal mass felt, NT.   Assessment/Plan:  42 y.o. G4P4   1. Menorrhagia with irregular cycle Previously diagnosed probable SM Myomas 2.5 cm.  Start on Progestin only pill.  Condom use as well.  F/U Sonohysterogram. - [place] Sonohysterogram; Future  2. Secondary anemia Probably at least partly due to Menometrorrhagia/SM Myoma.  Latest Hb 9.6 on 06/02/2017. - [place] Sonohysterogram; Future  3. Uterine leiomyoma, unspecified location Previous Fibroid on Pelvic US 2011 and Fibroid uterus on gyn exam today. - [place] Sonohysterogram; Future  Other orders - Cyanocobalamin (VITAMIN B-12 PO); Take by mouth daily. - Norethindrone 0.35 mg 1 tab PO daily - Iron liquid  Counseling on above issues >50% x 25  minutes.  Princess Bruins MD, 3:14 PM 07/01/2017

## 2017-07-03 ENCOUNTER — Encounter: Payer: Self-pay | Admitting: Obstetrics & Gynecology

## 2017-07-03 NOTE — Patient Instructions (Addendum)
1. Menorrhagia with irregular cycle Previously diagnosed probable SM Myomas 2.5 cm.  Start on Progestin only pill.  Condom use as well.  F/U Sonohysterogram. - [place] Sonohysterogram; Future  2. Secondary anemia Probably at least partly due to Menometrorrhagia/SM Myoma.  Latest Hb 9.6 on 06/02/2017. - [place] Sonohysterogram; Future  3. Uterine leiomyoma, unspecified location Previous Fibroid on Pelvic US 2011 and Fibroid uterus on gyn exam today. - [place] Sonohysterogram; Future  Other orders - Cyanocobalamin (VITAMIN B-12 PO); Take by mouth daily. - Norethindrone 0.35 mg 1 tab PO daily - Iron liquid   Rita Dean, good seeing you today!     Sonohysterogram A sonohysterogram is a procedure to examine the inside of the uterus. This exam uses sound waves that are sent to a computer to make images of the lining of the uterus (endometrium). To get the best images, a germ-free, salt-water solution (sterile saline) is put into the uterus through the vagina. You may have this procedure if you have certain reproductive problems, such as abnormal bleeding, infertility, or miscarriage. This procedure can show what may be causing these problems. Possible causes include scarring or abnormal growths such as fibroids inside your uterus. It can also show if your uterus is an abnormal shape or if the lining of the uterus is too thin. Tell a health care provider about:  All medicines you are taking, including vitamins, herbs, eye drops, creams, and over-the-counter medicines.  Any allergies you have.  Any blood disorders you have.  Any surgeries you have had.  Any medical conditions you have.  Whether you are pregnant or may be pregnant.  The date of the first day of your last period.  Any signs of infection, such as fever, pain in your lower abdomen, or abnormal discharge from your vagina. What are the risks? Generally, this is a safe procedure. However, problems may occur, including:  Abdominal pain or  cramping.  Light bleeding (spotting).  Increased vaginal discharge.  Infection.  What happens before the procedure?  Your health care provider may have you take an over-the-counter pain medicine.  You may be given medicine to stop any abnormal bleeding.  You may be given antibiotic medicine to help prevent infection.  You may be asked to take a pregnancy test. This is usually in the form of a urine test.  You may have a pelvic exam.  You will be asked to empty your bladder. What happens during the procedure?  You will lie down on the exam table with your feet in stirrups or with your knees bent and your feet flat on the table.  A slender, handheld device (transducer) will be lubricated and placed into your vagina.  The transducer will be positioned to send sound waves to your uterus. The sound waves are sent to a computer and are turned into images, which your health care provider sees during the procedure.  The transducer will be removed from your vagina.  An instrument will be inserted to widen the opening of your vagina (speculum).  A swab with germ-killing solution (antiseptic) will be used to clean the opening to your uterus (cervix).  A long, thin tube (catheter) will be placed through your cervix into your uterus.  The speculum will be removed.  The transducer will be placed back into your vagina to take more images.  Your uterus will be filled with a germ-free, salt-water solution (sterile saline) through the catheter. You may feel some cramping.  A fluid that contains air bubbles may  be sent through the catheter to make it easier to see the fallopian tubes.  The transducer and catheter will be removed. The procedure may vary among health care providers and hospitals. What happens after the procedure?  It is up to you to get the results of your procedure. Ask your health care provider, or the department that is doing the procedure, when your results will be  ready. Summary  A sonohysterogram is a procedure that creates images of the inside of the uterus.  The risks of this procedure are very low. Most women experience cramping and spotting after the procedure.  You may need to have a pelvic exam and take a pregnancy test before this procedure. This procedure will not be done if you are pregnant or have an infection. This information is not intended to replace advice given to you by your health care provider. Make sure you discuss any questions you have with your health care provider. Document Released: 09/13/2013 Document Revised: 03/25/2016 Document Reviewed: 03/25/2016 Elsevier Interactive Patient Education  2017 [employer].

## 2017-07-04 ENCOUNTER — Encounter: Payer: Self-pay | Admitting: Gastroenterology

## 2017-07-14 ENCOUNTER — Inpatient Hospital Stay: Payer: 59 | Attending: Hematology and Oncology

## 2017-07-14 DIAGNOSIS — D500 Iron deficiency anemia secondary to blood loss (chronic): Secondary | ICD-10-CM | POA: Insufficient documentation

## 2017-07-14 DIAGNOSIS — D510 Vitamin B12 deficiency anemia due to intrinsic factor deficiency: Secondary | ICD-10-CM | POA: Diagnosis present

## 2017-07-14 DIAGNOSIS — K2970 Gastritis, unspecified, without bleeding: Secondary | ICD-10-CM | POA: Insufficient documentation

## 2017-07-14 LAB — CBC WITH DIFFERENTIAL (CANCER CENTER ONLY)
Basophils Absolute: 0 10*3/uL (ref 0.0–0.1)
Basophils Relative: 1 %
Eosinophils Absolute: 0.5 10*3/uL (ref 0.0–0.5)
Eosinophils Relative: 9 %
HEMATOCRIT: 31.8 % — AB (ref 34.8–46.6)
HEMOGLOBIN: 9.7 g/dL — AB (ref 11.6–15.9)
Lymphocytes Relative: 26 %
Lymphs Abs: 1.4 10*3/uL (ref 0.9–3.3)
MCH: 23.3 pg — ABNORMAL LOW (ref 25.1–34.0)
MCHC: 30.5 g/dL — AB (ref 31.5–36.0)
MCV: 76.3 fL — AB (ref 79.5–101.0)
MONO ABS: 0.5 10*3/uL (ref 0.1–0.9)
MONOS PCT: 10 %
NEUTROS ABS: 2.8 10*3/uL (ref 1.5–6.5)
NEUTROS PCT: 54 %
Platelet Count: 253 10*3/uL (ref 145–400)
RBC: 4.17 MIL/uL (ref 3.70–5.45)
RDW: 17.2 % — AB (ref 11.2–14.5)
WBC Count: 5.1 10*3/uL (ref 3.9–10.3)

## 2017-07-14 LAB — CMP (CANCER CENTER ONLY)
ALK PHOS: 58 U/L (ref 40–150)
ALT: 10 U/L (ref 0–55)
ANION GAP: 9 (ref 3–11)
AST: 19 U/L (ref 5–34)
Albumin: 3.7 g/dL (ref 3.5–5.0)
BILIRUBIN TOTAL: 1.3 mg/dL — AB (ref 0.2–1.2)
BUN: 9 mg/dL (ref 7–26)
CALCIUM: 9 mg/dL (ref 8.4–10.4)
CO2: 24 mmol/L (ref 22–29)
CREATININE: 1.2 mg/dL — AB (ref 0.60–1.10)
Chloride: 105 mmol/L (ref 98–109)
GFR, Estimated: 55 mL/min — ABNORMAL LOW (ref >60)
GLUCOSE: 81 mg/dL (ref 70–140)
Potassium: 3.6 mmol/L (ref 3.5–5.1)
Sodium: 138 mmol/L (ref 136–145)
TOTAL PROTEIN: 6.9 g/dL (ref 6.4–8.3)

## 2017-07-14 LAB — LACTATE DEHYDROGENASE: LDH: 152 U/L (ref 125–245)

## 2017-07-14 LAB — IRON AND TIBC
IRON: 27 ug/dL — AB (ref 41–142)
Saturation Ratios: 8 % — ABNORMAL LOW (ref 21–57)
TIBC: 350 ug/dL (ref 236–444)
UIBC: 323 ug/dL

## 2017-07-14 LAB — FERRITIN: Ferritin: 4 ng/mL — ABNORMAL LOW (ref 9–269)

## 2017-07-14 LAB — VITAMIN B12: VITAMIN B 12: 714 pg/mL (ref 180–914)

## 2017-07-17 ENCOUNTER — Encounter: Payer: Self-pay | Admitting: Hematology and Oncology

## 2017-07-17 ENCOUNTER — Telehealth: Payer: Self-pay | Admitting: Hematology and Oncology

## 2017-07-17 ENCOUNTER — Inpatient Hospital Stay (HOSPITAL_BASED_OUTPATIENT_CLINIC_OR_DEPARTMENT_OTHER): Payer: 59 | Admitting: Hematology and Oncology

## 2017-07-17 VITALS — BP 108/54 | HR 81 | Temp 97.4°F | Resp 18 | Ht 66.0 in | Wt 147.1 lb

## 2017-07-17 DIAGNOSIS — D500 Iron deficiency anemia secondary to blood loss (chronic): Secondary | ICD-10-CM

## 2017-07-17 DIAGNOSIS — K2970 Gastritis, unspecified, without bleeding: Secondary | ICD-10-CM

## 2017-07-17 DIAGNOSIS — D510 Vitamin B12 deficiency anemia due to intrinsic factor deficiency: Secondary | ICD-10-CM

## 2017-07-17 NOTE — Telephone Encounter (Signed)
Gave patient avs and calendar with appts per 3/7 los.  °

## 2017-07-18 LAB — METHYLMALONIC ACID, SERUM: METHYLMALONIC ACID, QUANTITATIVE: 110 nmol/L (ref 0–378)

## 2017-07-21 ENCOUNTER — Inpatient Hospital Stay: Payer: 59

## 2017-07-21 VITALS — BP 108/71 | HR 72 | Temp 98.3°F | Resp 16

## 2017-07-21 DIAGNOSIS — D519 Vitamin B12 deficiency anemia, unspecified: Secondary | ICD-10-CM

## 2017-07-21 DIAGNOSIS — D500 Iron deficiency anemia secondary to blood loss (chronic): Secondary | ICD-10-CM | POA: Diagnosis not present

## 2017-07-21 MED ORDER — SODIUM CHLORIDE 0.9 % IV SOLN
Freq: Once | INTRAVENOUS | Status: AC
  Administered 2017-07-21: 08:00:00 via INTRAVENOUS

## 2017-07-21 MED ORDER — FERUMOXYTOL INJECTION 510 MG/17 ML
510.0000 mg | Freq: Once | INTRAVENOUS | Status: AC
  Administered 2017-07-21: 510 mg via INTRAVENOUS
  Filled 2017-07-21: qty 17

## 2017-07-21 NOTE — Patient Instructions (Signed)
Ferumoxytol injection / feraheme °What is this medicine? °FERUMOXYTOL is an iron complex. Iron is used to make healthy red blood cells, which carry oxygen and nutrients throughout the body. This medicine is used to treat iron deficiency anemia in people with chronic kidney disease. °This medicine may be used for other purposes; ask your health care provider or pharmacist if you have questions. °COMMON BRAND NAME(S): Feraheme °What should I tell my health care provider before I take this medicine? °They need to know if you have any of these conditions: °-anemia not caused by low iron levels °-high levels of iron in the blood °-magnetic resonance imaging (MRI) test scheduled °-an unusual or allergic reaction to iron, other medicines, foods, dyes, or preservatives °-pregnant or trying to get pregnant °-breast-feeding °How should I use this medicine? °This medicine is for injection into a vein. It is given by a health care professional in a hospital or clinic setting. °Talk to your pediatrician regarding the use of this medicine in children. Special care may be needed. °Overdosage: If you think you have taken too much of this medicine contact a poison control center or emergency room at once. °NOTE: This medicine is only for you. Do not share this medicine with others. °What if I miss a dose? °It is important not to miss your dose. Call your doctor or health care professional if you are unable to keep an appointment. °What may interact with this medicine? °This medicine may interact with the following medications: °-other iron products °This list may not describe all possible interactions. Give your health care provider a list of all the medicines, herbs, non-prescription drugs, or dietary supplements you use. Also tell them if you smoke, drink alcohol, or use illegal drugs. Some items may interact with your medicine. °What should I watch for while using this medicine? °Visit your doctor or healthcare professional  regularly. Tell your doctor or healthcare professional if your symptoms do not start to get better or if they get worse. You may need blood work done while you are taking this medicine. °You may need to follow a special diet. Talk to your doctor. Foods that contain iron include: whole grains/cereals, dried fruits, beans, or peas, leafy green vegetables, and organ meats (liver, kidney). °What side effects may I notice from receiving this medicine? °Side effects that you should report to your doctor or health care professional as soon as possible: °-allergic reactions like skin rash, itching or hives, swelling of the face, lips, or tongue °-breathing problems °-changes in blood pressure °-feeling faint or lightheaded, falls °-fever or chills °-flushing, sweating, or hot feelings °-swelling of the ankles or feet °Side effects that usually do not require medical attention (report to your doctor or health care professional if they continue or are bothersome): °-diarrhea °-headache °-nausea, vomiting °-stomach pain °This list may not describe all possible side effects. Call your doctor for medical advice about side effects. You may report side effects to FDA at 1-800-FDA-1088. °Where should I keep my medicine? °This drug is given in a hospital or clinic and will not be stored at home. °NOTE: This sheet is a summary. It may not cover all possible information. If you have questions about this medicine, talk to your doctor, pharmacist, or health care provider. °© 2018 Elsevier/Gold Standard (2015-06-01 12:41:49) ° °

## 2017-07-27 NOTE — Assessment & Plan Note (Signed)
In the context of elevated pernicious anemia suspected due to discovery of decreased B12 level in the setting of positive antiparietal cell antibodies.  EGD demonstrates mild gastritis, no evidence of malignancy.  Plan: -B12 is adequately replaced by oral supplement at this time.  Patient will decrease the amount to 1 drop/day

## 2017-07-27 NOTE — Progress Notes (Signed)
[facility] Follow-up Visit:  Assessment: Iron deficiency anemia due to chronic blood loss 42 y.o. female with iron deficiency anemia due to heavy menstrual bleeding with difficulties tolerating oral iron supplementation.  Currently, appears to be tolerating liquid oral iron supplement.  Iron levels are down relative to the previous values without significant decline in the hemoglobin.  Likely a insufficient replacement compared to the rate of blood loss with menstrual periods.   Plan: --Continue oral Fe supplementation as before -Feraheme 510 mg IV x2 -Return to clinic in 8 weeks with labs 3 days prior to assess replacement quality  Pernicious anemia In the context of elevated pernicious anemia suspected due to discovery of decreased B12 level in the setting of positive antiparietal cell antibodies.  EGD demonstrates mild gastritis, no evidence of malignancy.  Plan: -B12 is adequately replaced by oral supplement at this time.  Patient will decrease the amount to 1 drop/day  Voice recognition software was used and creation of this note. Despite my best effort at editing the text, some misspelling/errors may have occurred.   Orders Placed This Encounter  Procedures  . CBC with Differential (Cancer Center Only)    Standing Status:   Future    Standing Expiration Date:   07/18/2018  . CMP ([facility] only)    Standing Status:   Future    Standing Expiration Date:   07/18/2018  . Lactate dehydrogenase (LDH)    Standing Status:   Future    Standing Expiration Date:   07/17/2018  . Iron and TIBC    Standing Status:   Future    Standing Expiration Date:   07/18/2018  . Ferritin    Standing Status:   Future    Standing Expiration Date:   07/18/2018  . Vitamin B12    Standing Status:   Future    Standing Expiration Date:   07/17/2018  . Methylmalonic acid, serum    Standing Status:   Future    Standing Expiration Date:   07/17/2018  . Folate, Serum    Standing Status:    Future    Standing Expiration Date:   07/17/2018   All questions were answered.  . The patient knows to call the clinic with any problems, questions or concerns.  This note was electronically signed.    History of Presenting Illness Rita Dean is a 42 y.o. female followed in the [facility] for deficiency anemia due to chronic blood loss, referred by Harrison Mons, PA-C.  Patient's past medical history significant for single kidney secondary to donation in 2011.  Patient reports long-term history of iron deficiency.  Her symptoms include smoothing of the tongue, pica with ice cravings, soreness of the tongue which has recently improved.  In the past, she has been tried on ferrous gluconate for a month and that caused severe constipation no improvement in energy level.  Presently, she is taking a plant-based ferrous supplement  [name]).  With the more reliable oral intake of iron, she appears to have improvement in her energy and resolution of the tongue discomfort.  Patient denies alcohol abuse or significant use.  She is not a smoker.  At the present time, patient is supplementing her iron using [employer] Plant Force Liquid Iron supplement.  At last visit, patient was advised to start the B12 injections due to discovered B12 deficiency.  She did not take injections, but instead started oral supplementation with liquid supplement.  Overall, patient reports  gradual and slight improvement in the fatigue.  No new symptoms since the last clinic visit.  Oncological/hematological History: --Labs, 02/15/17: Hgb 7.4, MCV 59.0, MCH 17.1,               Plt 429; Fe   11, FeSat   3%, TIBC 407, Ferritin 3; --Labs, 04/01/17: Hgb 8.7, MCV 65.1, MCH 19.8,               Plt 275; Fe 278, FeSat 76%, TIBC    ..., Ferritin 5, Folate 113, Vit B12 231, MMA 228  --Labs, 06/02/17: Hgb 9.6, MCV 73.7, MCH 22.1,               Plt 264;                             MMA 151, Homocysteine 7.6;  Intrinsic factor 1.0, anti-Parietal cell IgG 38.7(up) --EGD, 06/30/17: Mild gastric inflammation and erythema. Pathology --negative for H. pylori, dysplasia, or metaplasia. --Labs, 07/14/17: Hgb 9.7, MCV 76.3, MCH 23.3, MCHC 30.5, RDW 17.2, Plt 253; Fe   27, FeSat   8%, TIBC 350, Ferritin 4; Vit B12 714; LDH 152,    Medical History: Past Medical History:  Diagnosis Date  . Allergy   . Pernicious anemia     Surgical History: Past Surgical History:  Procedure Laterality Date  . NEPHRECTOMY TRANSPLANTED ORGAN  2011   donated to her mother    Family History: Family History  Problem Relation Age of Onset  . Diabetes Mother   . Hypertension Mother   . Kidney disease Mother   . Seizures Sister   . Asthma Son        childhood  . Diabetes Maternal Grandmother   . Stroke Maternal Grandmother   . Prostate cancer Maternal Grandfather   . Colon cancer Neg Hx   . Esophageal cancer Neg Hx   . Liver disease Neg Hx     Social History: Social History   Socioeconomic History  . Marital status: Married    Spouse name: Shawn  . Number of children: 3  . Years of education: Master's  . Highest education level: Not on file  Social Needs  . Financial resource strain: Not on file  . Food insecurity - worry: Not on file  . Food insecurity - inability: Not on file  . Transportation needs - medical: Not on file  . Transportation needs - non-medical: Not on file  Occupational History  . Occupation: Solicitor    Comment: Engineering/IT company  Tobacco Use  . Smoking status: Never Smoker  . Smokeless tobacco: Never Used  Substance and Sexual Activity  . Alcohol use: No    Alcohol/week: 0.0 oz  . Drug use: No  . Sexual activity: Yes    Partners: Male    Birth control/protection: None    Comment: 1ST intercourse- 13, partners- 2, married- 19 yrs   Other Topics Concern  . Not on file  Social History Narrative   Lives with her husband and their three children.   Work -  Sports administrator   Exercise - 2-3 times per week   Diet - healthy   Donated kidney to mom in 2011    Allergies: Allergies  Allergen Reactions  . Shellfish Allergy Rash    Medications:  Current Outpatient Medications  Medication Sig Dispense Refill  . Cyanocobalamin (VITAMIN B-12 PO) Take by mouth daily.    . IRON  PO Take 20 mLs by mouth daily.      No current facility-administered medications for this visit.     Review of Systems: Review of Systems  Constitutional: Positive for fatigue.  All other systems reviewed and are negative.    PHYSICAL EXAMINATION Blood pressure (!) 108/54, pulse 81, temperature (!) 97.4 F (36.3 C), temperature source Oral, resp. rate 18, height _0  (1.676 m), weight 147 lb 1.6 oz (66.7 kg), SpO2 100 %.  ECOG PERFORMANCE STATUS: 2 - Symptomatic, <50% confined to bed  Physical Exam  Constitutional: She is oriented to person, place, and time and well-developed, well-nourished, and in no distress. No distress.  HENT:  Head: Normocephalic and atraumatic.  Mouth/Throat: Oropharynx is clear and moist. No oropharyngeal exudate.  Eyes: Conjunctivae and EOM are normal. Pupils are equal, round, and reactive to light. No scleral icterus.  Neck: No thyromegaly present.  Cardiovascular: Normal rate, regular rhythm and normal heart sounds.  No murmur heard. Pulmonary/Chest: Breath sounds normal. No respiratory distress. She has no wheezes. She has no rales. She exhibits no tenderness.  Abdominal: Soft. Bowel sounds are normal. She exhibits no distension and no mass. There is no tenderness. There is no rebound and no guarding.  Lymphadenopathy:    She has no cervical adenopathy.  Neurological: She is alert and oriented to person, place, and time. She has normal reflexes. No cranial nerve deficit.  Skin: Skin is warm and dry. No rash noted. She is not diaphoretic. No erythema.     LABORATORY DATA: I have personally reviewed the data as  listed: Appointment on 07/14/2017  Component Date Value Ref Range Status  . Methylmalonic Acid, Quantitative 07/14/2017 110  0 - 378 nmol/L Final  . Disclaimer: 07/14/2017 Comment   Final   Comment: (NOTE) This test was developed and its performance characteristics determined by [facility]. It has not been cleared or approved by the Food and Drug Administration. Performed At: [facility] [place], [place] [id number] Rush Farmer MD [id number] Performed at [facility], [place] [address], [place], [place] [address]   . Vitamin B-12 07/14/2017 714  180 - 914 pg/mL Final   Comment: (NOTE) This assay is not validated for testing neonatal or myeloproliferative syndrome specimens for Vitamin B12 levels. Performed at [facility], [place] [address], [place], [place] [address]   . Iron 07/14/2017 27* 41 - 142 ug/dL Final  . TIBC 07/14/2017 350  236 - 444 ug/dL Final  . Saturation Ratios 07/14/2017 8* 21 - 57 % Final  . UIBC 07/14/2017 323  ug/dL Final   Performed at [facility], [place] [address] [place], [place] [address]  . Ferritin 07/14/2017 4* 9 - 269 ng/mL Final   Performed at [facility], [place] [address], [place], [place] [address]  . LDH 07/14/2017 152  125 - 245 U/L Final   Performed at [facility], [place] [address] [place], [place] [address]  . Sodium 07/14/2017 138  136 - 145 mmol/L Final  . Potassium 07/14/2017 3.6  3.5 - 5.1 mmol/L Final  . Chloride 07/14/2017 105  98 - 109 mmol/L Final  . CO2 07/14/2017 24  22 - 29 mmol/L Final  . Glucose, Bld 07/14/2017 81  70 - 140 mg/dL Final  . BUN 07/14/2017 9  7 - 26 mg/dL Final  . Creatinine 07/14/2017 1.20* 0.60 - 1.10 mg/dL Final  . Calcium 07/14/2017 9.0  8.4 -  10.4 mg/dL Final  . Total Protein 07/14/2017 6.9  6.4 - 8.3 g/dL Final  . Albumin 07/14/2017 3.7  3.5 - 5.0 g/dL Final  . AST 07/14/2017 19  5 -  34 U/L Final  . ALT 07/14/2017 10  0 - 55 U/L Final  . Alkaline Phosphatase 07/14/2017 58  40 - 150 U/L Final  . Total Bilirubin 07/14/2017 1.3* 0.2 - 1.2 mg/dL Final  . GFR, Est Non Af Am 07/14/2017 55* >60 mL/min Final  . GFR, Est AFR Am 07/14/2017 >60  >60 mL/min Final   Comment: (NOTE) The eGFR has been calculated using the CKD EPI equation. This calculation has not been validated in all clinical situations. eGFR's persistently <60 mL/min signify possible Chronic Kidney Disease.   Georgiann Hahn gap 07/14/2017 9  3 - 11 Final   Performed at [facility], [place] [address] [place], [place] [address]  . WBC Count 07/14/2017 5.1  3.9 - 10.3 K/uL Final  . RBC 07/14/2017 4.17  3.70 - 5.45 MIL/uL Final  . Hemoglobin 07/14/2017 9.7* 11.6 - 15.9 g/dL Final  . HCT 07/14/2017 31.8* 34.8 - 46.6 % Final  . MCV 07/14/2017 76.3* 79.5 - 101.0 fL Final  . MCH 07/14/2017 23.3* 25.1 - 34.0 pg Final  . MCHC 07/14/2017 30.5* 31.5 - 36.0 g/dL Final  . RDW 07/14/2017 17.2* 11.2 - 14.5 % Final  . Platelet Count 07/14/2017 253  145 - 400 K/uL Final  . Neutrophils Relative % 07/14/2017 54  % Final  . Neutro Abs 07/14/2017 2.8  1.5 - 6.5 K/uL Final  . Lymphocytes Relative 07/14/2017 26  % Final  . Lymphs Abs 07/14/2017 1.4  0.9 - 3.3 K/uL Final  . Monocytes Relative 07/14/2017 10  % Final  . Monocytes Absolute 07/14/2017 0.5  0.1 - 0.9 K/uL Final  . Eosinophils Relative 07/14/2017 9  % Final  . Eosinophils Absolute 07/14/2017 0.5  0.0 - 0.5 K/uL Final  . Basophils Relative 07/14/2017 1  % Final  . Basophils Absolute 07/14/2017 0.0  0.0 - 0.1 K/uL Final   Performed at [facility], [place] [address] [place], [place] [address]       Ardath Sax, MD

## 2017-07-27 NOTE — Assessment & Plan Note (Signed)
42 y.o. female with iron deficiency anemia due to heavy menstrual bleeding with difficulties tolerating oral iron supplementation.  Currently, appears to be tolerating liquid oral iron supplement.  Iron levels are down relative to the previous values without significant decline in the hemoglobin.  Likely a insufficient replacement compared to the rate of blood loss with menstrual periods.   Plan: --Continue oral Fe supplementation as before -Feraheme 510 mg IV x2 -Return to clinic in 8 weeks with labs 3 days prior to assess replacement quality

## 2017-07-28 ENCOUNTER — Inpatient Hospital Stay: Payer: 59

## 2017-07-28 VITALS — BP 111/63 | HR 67 | Temp 98.2°F | Resp 16

## 2017-07-28 DIAGNOSIS — D519 Vitamin B12 deficiency anemia, unspecified: Secondary | ICD-10-CM

## 2017-07-28 DIAGNOSIS — D500 Iron deficiency anemia secondary to blood loss (chronic): Secondary | ICD-10-CM | POA: Diagnosis not present

## 2017-07-28 MED ORDER — SODIUM CHLORIDE 0.9 % IV SOLN
Freq: Once | INTRAVENOUS | Status: AC
  Administered 2017-07-28: 09:00:00 via INTRAVENOUS

## 2017-07-28 MED ORDER — SODIUM CHLORIDE 0.9 % IV SOLN
510.0000 mg | Freq: Once | INTRAVENOUS | Status: AC
  Administered 2017-07-28: 510 mg via INTRAVENOUS
  Filled 2017-07-28: qty 17

## 2017-07-28 NOTE — Patient Instructions (Signed)

## 2017-07-31 ENCOUNTER — Encounter: Payer: Self-pay | Admitting: Obstetrics & Gynecology

## 2017-07-31 ENCOUNTER — Other Ambulatory Visit: Payer: Self-pay | Admitting: Obstetrics & Gynecology

## 2017-07-31 ENCOUNTER — Ambulatory Visit (INDEPENDENT_AMBULATORY_CARE_PROVIDER_SITE_OTHER): Payer: 59 | Admitting: Obstetrics & Gynecology

## 2017-07-31 ENCOUNTER — Ambulatory Visit (INDEPENDENT_AMBULATORY_CARE_PROVIDER_SITE_OTHER): Payer: 59

## 2017-07-31 DIAGNOSIS — N921 Excessive and frequent menstruation with irregular cycle: Secondary | ICD-10-CM

## 2017-07-31 DIAGNOSIS — D251 Intramural leiomyoma of uterus: Secondary | ICD-10-CM

## 2017-07-31 DIAGNOSIS — D250 Submucous leiomyoma of uterus: Secondary | ICD-10-CM

## 2017-07-31 DIAGNOSIS — D259 Leiomyoma of uterus, unspecified: Secondary | ICD-10-CM

## 2017-07-31 DIAGNOSIS — D649 Anemia, unspecified: Secondary | ICD-10-CM

## 2017-07-31 DIAGNOSIS — N920 Excessive and frequent menstruation with regular cycle: Secondary | ICD-10-CM

## 2017-07-31 DIAGNOSIS — D219 Benign neoplasm of connective and other soft tissue, unspecified: Secondary | ICD-10-CM

## 2017-07-31 NOTE — Progress Notes (Signed)
Rita Dean 29-Feb-1976 [id number]        42 y.o.  G4P4   RP: Menorrhagia with probable SM Myoma for SonoHysterogram  HPI: Progestin only pill started on 07/01/2017.  Well tolerated and stopped her heavy bleeding.  Currently on day 2 of her menses with moderated flow.  No pelvic pain.  Pelvic US 06/2009 showed a probable SM Fibroid 2.5 cm, no further testing or procedure done.  Worsening heavy periods with anemia.  Under the care of Hemato for anemia, on liquid iron and vitamin B12.  Did a GE workup which was negative.   OB History  Gravida Para Term Preterm AB Living  4 4       3   SAB TAB Ectopic Multiple Live Births               # Outcome Date GA Lbr Len/2nd Weight Sex Delivery Anes PTL Lv  4 Para           3 Para           2 Para           1 Para             Past medical history,surgical history, problem list, medications, allergies, family history and social history were all reviewed and documented in the EPIC chart.   Directed ROS with pertinent positives and negatives documented in the history of present illness/assessment and plan.  Exam:  There were no vitals filed for this visit. General appearance:  Normal                                                                    Sono Infusion Hysterogram ( procedure note)   The initial transvaginal ultrasound demonstrated the following: Anteverted uterus measuring 12.94 x 10.86 x 7.09 cm.  Intramural fibroids measuring 4.2 x 3.5, 3.8 x 3.3, 5.2 x 3.4, 3.7 x 3.4, 2.4 x 2.2 cm.  Submucous myoma measuring 3.8 x 3.1 cm.  The endometrial lining is normal at 7.3 mm.  Right ovary with a thin-walled echo-free cyst measuring 2.9 x 3.2 cm.  Left ovarian follicle measuring 2.0 x 2.0 cm.  No free fluid in the posterior cul-de-sac.  The speculum  was inserted and the cervix cleansed with Betadine solution after confirming that patient has no allergies.A small sonohysterography catheterwas utilized.  Insertion was facilitated with ring  forceps, using a spear-like motion the catheter was inserted to the fundus of the uterus. The speculum is then removed carefully to avoid dislodging the catheter. The catheter was flushed with sterile saline delete prior to insertion to rid it of small amounts of air.the sterile saline solution was infused into the uterine cavity as a vaginal ultrasound probe was then placed in the vagina for full visualization of the uterine cavity from a transvaginal approach. The following was noted: Following injection of saline the endometrium is filled, showing an anterior defect measuring 3.4 x 2.6 x 3.3 cm probably corresponding to a submucosal myoma.  The catheter was then removed after retrieving some of the saline from the intrauterine cavity. An endometrial biopsy was not done. Patient tolerated procedure well. She had received a tablet of Aleve for discomfort.    Assessment/Plan:  42 y.o. G4P4  1. Menorrhagia with irregular cycle Menorrhagia with secondary anemia probably due to the submucosal myoma and the overall increase in size of the uterus due to intramural myomas.  Decreased heavy menses since started on the progestin only pill.  Will continue.  2. Fibroids Patient desires preservation of her fertility at this time.  Will try to control the menorrhagia by removing the submucosal myoma and continuing on the progestin only birth control pill.  3. Fibroids, submucosal Submucosal myoma measuring 3.8 x 3.1 cm with a submucosal part measuring 3.4 x 2.6 x 3.3 cm.  Therefore the myoma is almost completely in the intrauterine cavity.  The endometrial line is normal.  Decision to proceed with hysteroscopy with excision of the submucosal myoma and D&C.  Information about the procedure will be added to the summary today.  The patient will follow up for a preop visit to further discuss the procedure, the risks and benefits.  Counseling and coordination of care on the above issues more than 50% for 15  minutes.  Princess Bruins MD, 10:30 AM 07/31/2017

## 2017-07-31 NOTE — Patient Instructions (Signed)
1. Menorrhagia with irregular cycle Menorrhagia with secondary anemia probably due to the submucosal myoma and the overall increase in size of the uterus due to intramural myomas.  Decreased heavy menses since started on the progestin only pill.  Will continue.  2. Fibroids Patient desires preservation of her fertility at this time.  Will try to control the menorrhagia by removing the submucosal myoma and continuing on the progestin only birth control pill.  3. Fibroids, submucosal Submucosal myoma measuring 3.8 x 3.1 cm with a submucosal part measuring 3.4 x 2.6 x 3.3 cm.  Therefore the myoma is almost completely in the intrauterine cavity.  The endometrial line is normal.  Decision to proceed with hysteroscopy with excision of the submucosal myoma and D&C.  Information about the procedure will be added to the summary today.  The patient will follow up for a preop visit to further discuss the procedure, the risks and benefits.  Roderic Ovens, good seeing you today!  Hysteroscopy Hysteroscopy is a procedure used for looking inside the womb (uterus). It may be done for various reasons, including:  To evaluate abnormal bleeding, fibroid (benign, noncancerous) tumors, polyps, scar tissue (adhesions), and possibly cancer of the uterus.  To look for lumps (tumors) and other uterine growths.  To look for causes of why a woman cannot get pregnant (infertility), causes of recurrent loss of pregnancy (miscarriages), or a lost intrauterine device (IUD).  To perform a sterilization by blocking the fallopian tubes from inside the uterus.  In this procedure, a thin, flexible tube with a tiny light and camera on the end of it (hysteroscope) is used to look inside the uterus. A hysteroscopy should be done right after a menstrual period to be sure you are not pregnant. LET [facility] CARE PROVIDER KNOW ABOUT:  Any allergies you have.  All medicines you are taking, including vitamins, herbs, eye drops, creams,  and over-the-counter medicines.  Previous problems you or members of your family have had with the use of anesthetics.  Any blood disorders you have.  Previous surgeries you have had.  Medical conditions you have. RISKS AND COMPLICATIONS Generally, this is a safe procedure. However, as with any procedure, complications can occur. Possible complications include:  Putting a hole in the uterus.  Excessive bleeding.  Infection.  Damage to the cervix.  Injury to other organs.  Allergic reaction to medicines.  Too much fluid used in the uterus for the procedure.  BEFORE THE PROCEDURE  Ask your health care provider about changing or stopping any regular medicines.  Do not take aspirin or blood thinners for 1 week before the procedure, or as directed by your health care provider. These can cause bleeding.  If you smoke, do not smoke for 2 weeks before the procedure.  In some cases, a medicine is placed in the cervix the day before the procedure. This medicine makes the cervix have a larger opening (dilate). This makes it easier for the instrument to be inserted into the uterus during the procedure.  Do not eat or drink anything for at least 8 hours before the surgery.  Arrange for someone to take you home after the procedure. PROCEDURE  You may be given a medicine to relax you (sedative). You may also be given one of the following: ? A medicine that numbs the area around the cervix (local anesthetic). ? A medicine that makes you sleep through the procedure (general anesthetic).  The hysteroscope is inserted through the vagina into the uterus. The camera  on the hysteroscope sends a picture to a TV screen. This gives the surgeon a good view inside the uterus.  During the procedure, air or a liquid is put into the uterus, which allows the surgeon to see better.  Sometimes, tissue is gently scraped from inside the uterus. These tissue samples are sent to a lab for testing. What  to expect after the procedure  If you had a general anesthetic, you may be groggy for a couple hours after the procedure.  If you had a local anesthetic, you will be able to go home as soon as you are stable and feel ready.  You may have some cramping. This normally lasts for a couple days.  You may have bleeding, which varies from light spotting for a few days to menstrual-like bleeding for 3-7 days. This is normal.  If your test results are not back during the visit, make an appointment with your health care provider to find out the results. This information is not intended to replace advice given to you by your health care provider. Make sure you discuss any questions you have with your health care provider. Document Released: 08/05/2000 Document Revised: 10/05/2015 Document Reviewed: 11/26/2012 Elsevier Interactive Patient Education  2017 [employer].

## 2017-09-02 ENCOUNTER — Telehealth: Payer: Self-pay | Admitting: *Deleted

## 2017-09-02 NOTE — Telephone Encounter (Signed)
Patient called to follow up from [place] on 07/31/17 still taking progestin pill daily as directed states she is on day 37 of bleeding. Pt said she has some type of daily bleeding, it can be spotting, medium flow, heavy at times, but she has to wear a pad daily due to this. She asked what other options to help stop the bleeding, she is married and the bleeding is causing issues with her sex life. Please advise

## 2017-09-03 NOTE — Telephone Encounter (Signed)
Bring in for an office visit with me to decide on management, other hormonal options or surgical.

## 2017-09-03 NOTE — Telephone Encounter (Signed)
Pt coming on 09/17/17 for pre op visit, will discuss then.

## 2017-09-08 ENCOUNTER — Telehealth: Payer: Self-pay | Admitting: Hematology and Oncology

## 2017-09-08 NOTE — Telephone Encounter (Signed)
Called pt re appt being moved due to pal - left vm for pt re appt.

## 2017-09-15 ENCOUNTER — Other Ambulatory Visit: Payer: 59

## 2017-09-17 ENCOUNTER — Ambulatory Visit: Payer: 59 | Admitting: Obstetrics & Gynecology

## 2017-09-17 ENCOUNTER — Encounter: Payer: Self-pay | Admitting: Obstetrics & Gynecology

## 2017-09-17 VITALS — BP 106/70

## 2017-09-17 DIAGNOSIS — Z01818 Encounter for other preprocedural examination: Secondary | ICD-10-CM | POA: Diagnosis not present

## 2017-09-17 DIAGNOSIS — D250 Submucous leiomyoma of uterus: Secondary | ICD-10-CM | POA: Diagnosis not present

## 2017-09-17 DIAGNOSIS — N921 Excessive and frequent menstruation with irregular cycle: Secondary | ICD-10-CM

## 2017-09-17 MED ORDER — NORETHINDRONE 0.35 MG PO TABS: 1.0000 | ORAL_TABLET | Freq: Every day | ORAL | 4 refills | Status: DC

## 2017-09-17 NOTE — Progress Notes (Signed)
    Rita Dean 07/13/1975 [id number]        41 y.o.  G4P4L3  Married.  2 sons and a daughter.  RP: Preop HSC/Myosure excision/D+C for a SM Myoma  HPI: Well on Progestin-only pill, but continues to have metrorrhagia and pelvic cramping.  Flow is not as heavy.     OB History  Gravida Para Term Preterm AB Living  4 4       3   SAB TAB Ectopic Multiple Live Births               # Outcome Date GA Lbr Len/2nd Weight Sex Delivery Anes PTL Lv  4 Para           3 Para           2 Para           1 Para             Past medical history,surgical history, problem list, medications, allergies, family history and social history were all reviewed and documented in the EPIC chart.   Directed ROS with pertinent positives and negatives documented in the history of present illness/assessment and plan.  Exam:  Vitals:   09/17/17 0810  BP: 106/70   General appearance:  Normal  Exam not redone today  Pelvic US/Sonohysterogram 07/31/2017:  Anteverted uterus measuring 12.94 x 10.86 x 7.09 cm.  Intramural fibroids measuring 4.2 x 3.5, 3.8 x 3.3, 5.2 x 3.4, 3.7 x 3.4, 2.4 x 2.2 cm.  Submucous myoma measuring 3.8 x 3.1 cm.  The endometrial lining is normal at 7.3 mm.  Right ovary with a thin-walled echo-free cyst measuring 2.9 x 3.2 cm.  Left ovarian follicle measuring 2.0 x 2.0 cm.  No free fluid in the posterior cul-de-sac. Following injection of saline the endometrium is filled, showing an anterior defect measuring 3.4 x 2.6 x 3.3 cm probably corresponding to a submucosal myoma.   Assessment/Plan:  42 y.o. G4P4   1. Preop examination Submucosal myoma anteriorly measuring 3.4 cm, associated with menometrorrhagia and dysmenorrhea.  Decision to proceed with a hysteroscopy, MyoSure excision of the submucosal myoma and a D&C.  Patient desires preservation of fertility.  Surgery, risks and benefits reviewed thoroughly with patient as described below.  2. Submucous myoma of uterus As above.  3.  Menometrorrhagia Continue on Progestin-only pill.  Other orders - norethindrone (MICRONOR,CAMILA,ERRIN) 0.35 MG tablet; Take 1 tablet (0.35 mg total) by mouth daily.                        Patient was counseled as to the risk of surgery to include the following:  1. Infection (prohylactic antibiotics will be administered)  2. DVT/Pulmonary Embolism (prophylactic pneumo compression stockings will be used)  3.Trauma to internal organs requiring additional surgical procedure to repair any injury to internal organs requiring perhaps additional hospitalization days.  4.Hemmorhage requiring transfusion and blood products which carry risks such as anaphylactic reaction, hepatitis and AIDS  Patient had received literature information on the procedure scheduled and all her questions were answered and fully accepts all risk.   Princess Bruins MD, 8:40 AM 09/17/2017

## 2017-09-17 NOTE — Patient Instructions (Signed)
1. Preop examination Submucosal myoma anteriorly measuring 3.4 cm, associated with menometrorrhagia and dysmenorrhea.  Decision to proceed with a hysteroscopy, MyoSure excision of the submucosal myoma and a D&C.  Patient desires preservation of fertility.  Surgery, risks and benefits reviewed thoroughly with patient as described below.  2. Submucous myoma of uterus As above.  3. Menometrorrhagia Continue on Progestin-only pill.  Other orders - norethindrone (MICRONOR,CAMILA,ERRIN) 0.35 MG tablet; Take 1 tablet (0.35 mg total) by mouth daily.                        Patient was counseled as to the risk of surgery to include the following:  1. Infection (prohylactic antibiotics will be administered)  2. DVT/Pulmonary Embolism (prophylactic pneumo compression stockings will be used)  3.Trauma to internal organs requiring additional surgical procedure to repair any injury to internal organs requiring perhaps additional hospitalization days.  4.Hemmorhage requiring transfusion and blood products which carry risks such as anaphylactic reaction, hepatitis and AIDS  Patient had received literature information on the procedure scheduled and all her questions were answered and fully accepts all risk.  Roderic Ovens, good seeing you today!  Hysteroscopy Hysteroscopy is a procedure used for looking inside the womb (uterus). It may be done for various reasons, including:  To evaluate abnormal bleeding, fibroid (benign, noncancerous) tumors, polyps, scar tissue (adhesions), and possibly cancer of the uterus.  To look for lumps (tumors) and other uterine growths.  To look for causes of why a woman cannot get pregnant (infertility), causes of recurrent loss of pregnancy (miscarriages), or a lost intrauterine device (IUD).  To perform a sterilization by blocking the fallopian tubes from inside the uterus.  In this procedure, a thin, flexible tube with a tiny light and camera on the end of it  (hysteroscope) is used to look inside the uterus. A hysteroscopy should be done right after a menstrual period to be sure you are not pregnant. LET [facility] CARE PROVIDER KNOW ABOUT:  Any allergies you have.  All medicines you are taking, including vitamins, herbs, eye drops, creams, and over-the-counter medicines.  Previous problems you or members of your family have had with the use of anesthetics.  Any blood disorders you have.  Previous surgeries you have had.  Medical conditions you have. RISKS AND COMPLICATIONS Generally, this is a safe procedure. However, as with any procedure, complications can occur. Possible complications include:  Putting a hole in the uterus.  Excessive bleeding.  Infection.  Damage to the cervix.  Injury to other organs.  Allergic reaction to medicines.  Too much fluid used in the uterus for the procedure.  BEFORE THE PROCEDURE  Ask your health care provider about changing or stopping any regular medicines.  Do not take aspirin or blood thinners for 1 week before the procedure, or as directed by your health care provider. These can cause bleeding.  If you smoke, do not smoke for 2 weeks before the procedure.  In some cases, a medicine is placed in the cervix the day before the procedure. This medicine makes the cervix have a larger opening (dilate). This makes it easier for the instrument to be inserted into the uterus during the procedure.  Do not eat or drink anything for at least 8 hours before the surgery.  Arrange for someone to take you home after the procedure. PROCEDURE  You may be given a medicine to relax you (sedative). You may also be given one of the following: ?  A medicine that numbs the area around the cervix (local anesthetic). ? A medicine that makes you sleep through the procedure (general anesthetic).  The hysteroscope is inserted through the vagina into the uterus. The camera on the hysteroscope sends a picture to  a TV screen. This gives the surgeon a good view inside the uterus.  During the procedure, air or a liquid is put into the uterus, which allows the surgeon to see better.  Sometimes, tissue is gently scraped from inside the uterus. These tissue samples are sent to a lab for testing. What to expect after the procedure  If you had a general anesthetic, you may be groggy for a couple hours after the procedure.  If you had a local anesthetic, you will be able to go home as soon as you are stable and feel ready.  You may have some cramping. This normally lasts for a couple days.  You may have bleeding, which varies from light spotting for a few days to menstrual-like bleeding for 3-7 days. This is normal.  If your test results are not back during the visit, make an appointment with your health care provider to find out the results. This information is not intended to replace advice given to you by your health care provider. Make sure you discuss any questions you have with your health care provider. Document Released: 08/05/2000 Document Revised: 10/05/2015 Document Reviewed: 11/26/2012 Elsevier Interactive Patient Education  2017 [employer].

## 2017-09-18 ENCOUNTER — Ambulatory Visit: Payer: 59 | Admitting: Hematology and Oncology

## 2017-09-18 ENCOUNTER — Telehealth: Payer: Self-pay

## 2017-09-18 NOTE — Telephone Encounter (Signed)
I called patient yesterday about scheduling surgery but she could not talk at work and said she would call back later. SHe called this morning in voice mail. I called her back but got her voice mail. Per DPR access note on file I went ahead and left quick review of insurance benefits and estimated [facility]GA surgery prepymt due one week prior to surgery. I left her the next 3 dates available and reminded her check in at [facility] is 2 hours prior to stated time. I asked her to consider this information and to call me when she can.

## 2017-09-18 NOTE — Telephone Encounter (Signed)
Spoke with patient and reviewed her insurance benefits and the estimated surgery prepymt amount due to [facility] by one week before surgery.  Patient was surprised by her deductible amount and the prepymt amount.  She is not financially able to schedule surgery at this time. I told her I will hold her surgery order and she can call me when she is ready. She agreed.

## 2017-09-18 NOTE — Telephone Encounter (Signed)
Opened in error. Had encounter opened already.

## 2017-09-19 ENCOUNTER — Inpatient Hospital Stay: Payer: 59 | Attending: Hematology and Oncology

## 2017-09-19 DIAGNOSIS — D510 Vitamin B12 deficiency anemia due to intrinsic factor deficiency: Secondary | ICD-10-CM | POA: Diagnosis present

## 2017-09-19 DIAGNOSIS — E538 Deficiency of other specified B group vitamins: Secondary | ICD-10-CM | POA: Insufficient documentation

## 2017-09-19 DIAGNOSIS — N920 Excessive and frequent menstruation with regular cycle: Secondary | ICD-10-CM | POA: Diagnosis not present

## 2017-09-19 DIAGNOSIS — D500 Iron deficiency anemia secondary to blood loss (chronic): Secondary | ICD-10-CM | POA: Diagnosis present

## 2017-09-19 LAB — CMP (CANCER CENTER ONLY)
ALT: 14 U/L (ref 0–55)
ANION GAP: 4 (ref 3–11)
AST: 19 U/L (ref 5–34)
Albumin: 4 g/dL (ref 3.5–5.0)
Alkaline Phosphatase: 55 U/L (ref 40–150)
BUN: 13 mg/dL (ref 7–26)
CHLORIDE: 105 mmol/L (ref 98–109)
CO2: 28 mmol/L (ref 22–29)
CREATININE: 1.26 mg/dL — AB (ref 0.60–1.10)
Calcium: 8.8 mg/dL (ref 8.4–10.4)
GFR, Estimated: 52 mL/min — ABNORMAL LOW (ref >60)
GLUCOSE: 81 mg/dL (ref 70–140)
Potassium: 3.9 mmol/L (ref 3.5–5.1)
Sodium: 137 mmol/L (ref 136–145)
Total Bilirubin: 0.9 mg/dL (ref 0.2–1.2)
Total Protein: 7.1 g/dL (ref 6.4–8.3)

## 2017-09-19 LAB — CBC WITH DIFFERENTIAL (CANCER CENTER ONLY)
Basophils Absolute: 0 10*3/uL (ref 0.0–0.1)
Basophils Relative: 1 %
Eosinophils Absolute: 0.3 10*3/uL (ref 0.0–0.5)
Eosinophils Relative: 8 %
HEMATOCRIT: 35.9 % (ref 34.8–46.6)
HEMOGLOBIN: 12 g/dL (ref 11.6–15.9)
LYMPHS ABS: 1 10*3/uL (ref 0.9–3.3)
Lymphocytes Relative: 31 %
MCH: 27.1 pg (ref 25.1–34.0)
MCHC: 33.3 g/dL (ref 31.5–36.0)
MCV: 81.5 fL (ref 79.5–101.0)
MONO ABS: 0.3 10*3/uL (ref 0.1–0.9)
MONOS PCT: 9 %
NEUTROS ABS: 1.7 10*3/uL (ref 1.5–6.5)
NEUTROS PCT: 51 %
Platelet Count: 218 10*3/uL (ref 145–400)
RBC: 4.4 MIL/uL (ref 3.70–5.45)
RDW: 20.6 % — ABNORMAL HIGH (ref 11.2–14.5)
WBC Count: 3.4 10*3/uL — ABNORMAL LOW (ref 3.9–10.3)

## 2017-09-19 LAB — LACTATE DEHYDROGENASE: LDH: 143 U/L (ref 125–245)

## 2017-09-19 LAB — IRON AND TIBC
Iron: 65 ug/dL (ref 41–142)
SATURATION RATIOS: 28 % (ref 21–57)
TIBC: 230 ug/dL — AB (ref 236–444)
UIBC: 165 ug/dL

## 2017-09-19 LAB — FOLATE: Folate: 15 ng/mL (ref >5.9)

## 2017-09-19 LAB — VITAMIN B12: Vitamin B-12: 314 pg/mL (ref 180–914)

## 2017-09-19 LAB — FERRITIN: FERRITIN: 47 ng/mL (ref 9–269)

## 2017-09-22 ENCOUNTER — Encounter: Payer: Self-pay | Admitting: Hematology and Oncology

## 2017-09-22 ENCOUNTER — Telehealth: Payer: Self-pay | Admitting: Hematology and Oncology

## 2017-09-22 ENCOUNTER — Inpatient Hospital Stay (HOSPITAL_BASED_OUTPATIENT_CLINIC_OR_DEPARTMENT_OTHER): Payer: 59 | Admitting: Hematology and Oncology

## 2017-09-22 VITALS — BP 113/75 | HR 81 | Temp 97.9°F | Resp 18 | Ht 66.0 in | Wt 144.7 lb

## 2017-09-22 DIAGNOSIS — E538 Deficiency of other specified B group vitamins: Secondary | ICD-10-CM

## 2017-09-22 DIAGNOSIS — D500 Iron deficiency anemia secondary to blood loss (chronic): Secondary | ICD-10-CM

## 2017-09-22 DIAGNOSIS — N920 Excessive and frequent menstruation with regular cycle: Secondary | ICD-10-CM

## 2017-09-22 LAB — METHYLMALONIC ACID, SERUM: Methylmalonic Acid, Quantitative: 139 nmol/L (ref 0–378)

## 2017-09-22 NOTE — Telephone Encounter (Signed)
Gave patient avs report and appointments for May and June. Perlov patient to [facility] and scheduled with APP per 5/13 los.

## 2017-09-29 ENCOUNTER — Inpatient Hospital Stay: Payer: 59

## 2017-09-29 VITALS — BP 111/68 | HR 69 | Temp 98.5°F | Resp 16

## 2017-09-29 DIAGNOSIS — D500 Iron deficiency anemia secondary to blood loss (chronic): Secondary | ICD-10-CM | POA: Diagnosis not present

## 2017-09-29 DIAGNOSIS — D519 Vitamin B12 deficiency anemia, unspecified: Secondary | ICD-10-CM

## 2017-09-29 MED ORDER — SODIUM CHLORIDE 0.9 % IV SOLN
Freq: Once | INTRAVENOUS | Status: AC
  Administered 2017-09-29: 08:00:00 via INTRAVENOUS

## 2017-09-29 MED ORDER — SODIUM CHLORIDE 0.9 % IV SOLN
510.0000 mg | Freq: Once | INTRAVENOUS | Status: AC
  Administered 2017-09-29: 510 mg via INTRAVENOUS
  Filled 2017-09-29: qty 17

## 2017-09-29 NOTE — Patient Instructions (Addendum)

## 2017-10-08 NOTE — Assessment & Plan Note (Addendum)
42 y.o. female with iron deficiency anemia due to heavy menstrual bleeding with difficulties tolerating oral iron supplementation.  Currently, appears to be tolerating liquid oral iron supplement, but due to persistent metrorrhagia, patient was treated with 2 doses of parenteral iron supplementation in March.  Lab work today demonstrates excellent response to treatment.  Hemoglobin is up and so is ferritin.  Concurrently, patient had an interval drop in the vitamin B12 level with known previous history of B12 deficiency for which patient refused intramuscular supplementation.  Plan: --Continue oral Fe supplementation as before, continue B12 supplementation -Proceed with Feraheme 510 mg IV x1; our goal is to keep ferritin over 50 -Return to clinic in 1 month with labs 3 days prior for hematological monitoring, clinic visit, and possible IV iron administration.

## 2017-10-08 NOTE — Progress Notes (Signed)
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 female with Dean deficiency anemia due to heavy menstrual bleeding with difficulties tolerating oral Dean supplementation.  Currently, appears to be tolerating liquid oral Dean supplement, but due to persistent metrorrhagia, patient was treated with 2 doses of parenteral Dean supplementation in March.  Lab work today demonstrates excellent response to treatment.  Hemoglobin is up and so is ferritin.  Concurrently, patient had an interval drop in the vitamin B12 level with known previous history of B12 deficiency for which patient refused intramuscular supplementation.  Plan: --Continue oral Fe supplementation as before, continue B12 supplementation -Proceed with Feraheme 510 mg IV x1; our goal is to keep ferritin over 50 -Return to clinic in 1 month with labs 3 days prior for hematological monitoring, clinic visit, and possible IV Dean administration.  Voice recognition software was used and creation of this note. Despite my best effort at editing the text, some misspelling/errors may have occurred.   Orders Placed This Encounter  Procedures  . CBC with Differential (Cancer Center Only)    Standing Status:   Future    Standing Expiration Date:   09/23/2018  . CMP ([facility] only)    Standing Status:   Future    Standing Expiration Date:   09/23/2018  . Dean and TIBC    Standing Status:   Future    Standing Expiration Date:   09/23/2018  . Ferritin    Standing Status:   Future    Standing Expiration Date:   09/23/2018   All questions were answered.  . The patient knows to call the clinic with any problems, questions or concerns.  This note was electronically signed.    History of Presenting Illness Rita Dean is a 42 y.o. female followed in the Rita [facility] for deficiency anemia due to chronic blood loss, referred by Rita Mons, PA-C.  Patient's past  medical history significant for single kidney secondary to donation in 2011.  Patient reports long-term history of Dean deficiency.  Her symptoms include smoothing of the tongue, pica with ice cravings, soreness of the tongue which has recently improved.  In the past, she has been tried on ferrous gluconate for a month and that caused severe constipation no improvement in energy level.  Presently, she is taking a plant-based ferrous supplement  Rita Dean).  With the more reliable oral intake of Dean, she appears to have improvement in her energy and resolution of the tongue discomfort.  Patient denies alcohol abuse or significant use.  She is not a smoker.  At the present time, patient is supplementing her Dean using Rita Hoff Herbs Plant Force Liquid Dean supplement.  At last visit, patient was advised to start the B12 injections due to discovered B12 deficiency.  She did not take injections, but instead started oral supplementation with liquid supplement.  Since the last visit to the clinic, patient reports essentially continuous vaginal bleeding lasting over 50 days.  She was seen by gynecology and started on progestin February 2019 with initial decrease in severity of the bleeding.  Additional assessment demonstrates a submucosal bleeding myeloma and also intramural myomas.  Potential treatments were discussed with the patient in detail and patient desired for fertility preservation and is presently evaluating her options for possible myomectomy, but procedure has been delayed due to cost of the treatment.  Oncological/hematological History: --Labs, 02/15/17: Hgb   7.4, MCV 59.0, MCH 17.1,  Plt 429; Fe   11, FeSat   3%, TIBC 407, Ferritin 3; --Labs, 04/01/17: Hgb   8.7, MCV 65.1, MCH 19.8,               Plt 275; Fe 278, FeSat 76%, TIBC    ..., Ferritin 5, Folate 113, Vit B12 231, MMA 228  --Labs, 06/02/17: Hgb   9.6, MCV 73.7, MCH 22.1,               Plt 264;                              MMA 151, Homocysteine 7.6; Intrinsic factor 1.0, anti-Parietal cell IgG 38.7(up) --EGD, 06/30/17: Mild gastric inflammation and erythema. Pathology --negative for H. pylori, dysplasia, or metaplasia. --Labs, 07/14/17: Hgb   9.7, MCV 76.3, MCH 23.3, MCHC 30.5, RDW 17.2, Plt 253; Fe   27, FeSat   8%, TIBC 350, Ferritin 4; Vit B12 714; LDH 152,  --Treatment:  --Feraheme 525m IV x1, 07/21/17  --Feraheme 5150mIV x1, 07/28/17 --Labs, 09/19/17: Hgb 12.0, MCV 81.5, MCH 27.1, MCHC 33.3, RDW 20.6, Plt 218; Fe 65, FeSat 28%, TIBC 230, Ferritin 47, Vit B12 314, Folate 15.0   Medical History: Past Medical History:  Diagnosis Date  . Allergy   . Pernicious anemia     Surgical History: Past Surgical History:  Procedure Laterality Date  . NEPHRECTOMY TRANSPLANTED ORGAN  2011   donated to her mother    Family History: Family History  Problem Relation Age of Onset  . Diabetes Mother   . Hypertension Mother   . Kidney disease Mother   . Seizures Sister   . Asthma Son        childhood  . Diabetes Maternal Grandmother   . Stroke Maternal Grandmother   . Prostate cancer Maternal Grandfather   . Colon cancer Neg Hx   . Esophageal cancer Neg Hx   . Liver disease Neg Hx     Social History: Social History   Socioeconomic History  . Marital status: Married    Spouse name: Rita [place]  . Number of children: 3  . Years of education: Master's  . Highest education level: Not on file  Occupational History  . Occupation: DiSolicitor  Comment: [employer] [place]. Financial resource strain: Not on file  . Food insecurity:    Worry: Not on file    Inability: Not on file  . Transportation needs:    Medical: Not on file    Non-medical: Not on file  Tobacco Use  . Smoking status: Never Smoker  . Smokeless tobacco: Never Used  Substance and Sexual Activity  . Alcohol use: No    Alcohol/week: 0.0 oz  . Drug use: No  . Sexual activity: Yes     Partners: Male    Birth control/protection: None    Comment: 1ST intercourse- 1572partners- 2, married- 1813rs   Lifestyle  . Physical activity:    Days per week: Not on file    Minutes per session: Not on file  . Stress: Not on file  Relationships  . Social connections:    Talks on phone: Not on file    Gets together: Not on file    Attends religious service: Not on file    Active member of club or organization: Not on file    Attends meetings of clubs or organizations: Not on file  Relationship status: Not on file  . Intimate partner violence:    Fear of current or ex partner: Not on file    Emotionally abused: Not on file    Physically abused: Not on file    Forced sexual activity: Not on file  Other Topics Concern  . Not on file  Social History Narrative   Lives with her husband and their three children.   Work - Sports administrator   Exercise - 2-3 times per week   Diet - healthy   Donated kidney to mom in 2011    Allergies: Allergies  Allergen Reactions  . Shellfish Allergy Rash    Medications:  Current Outpatient Medications  Medication Sig Dispense Refill  . Cyanocobalamin (VITAMIN B-12 PO) Take by mouth daily.    . Dean PO Take 20 mLs by mouth daily.     . norethindrone (MICRONOR,CAMILA,ERRIN) 0.35 MG tablet Take 1 tablet (0.35 mg total) by mouth daily. 3 Package 4   No current facility-administered medications for this visit.     Review of Systems: Review of Systems  Constitutional: Positive for fatigue.  All other systems reviewed and are negative.    PHYSICAL EXAMINATION Blood pressure 113/75, pulse 81, temperature 97.9 F (36.6 C), temperature source Oral, resp. rate 18, height '5\' 6"'  (1.676 m), weight 144 lb 11.2 oz (65.6 kg), SpO2 100 %.  ECOG PERFORMANCE STATUS: 2 - Symptomatic, <50% confined to bed  Physical Exam  Constitutional: She is oriented to person, place, and time and well-developed, well-nourished, and in no distress. No  distress.  HENT:  Head: Normocephalic and atraumatic.  Mouth/Throat: Oropharynx is clear and moist. No oropharyngeal exudate.  Eyes: Pupils are equal, round, and reactive to light. Conjunctivae and EOM are normal. No scleral icterus.  Neck: No thyromegaly present.  Cardiovascular: Normal rate, regular rhythm and normal heart sounds.  No murmur heard. Pulmonary/Chest: Breath sounds normal. No respiratory distress. She has no wheezes. She has no rales. She exhibits no tenderness.  Abdominal: Soft. Bowel sounds are normal. She exhibits no distension and no mass. There is no tenderness. There is no rebound and no guarding.  Lymphadenopathy:    She has no cervical adenopathy.  Neurological: She is alert and oriented to person, place, and time. She has normal reflexes. No cranial nerve deficit.  Skin: Skin is warm and dry. No rash noted. She is not diaphoretic. No erythema.     LABORATORY DATA: I have personally reviewed the data as listed: Appointment on 09/19/2017  Component Date Value Ref Range Status  . Folate 09/19/2017 15.0  >5.9 ng/mL Final   Performed at [facility], [place] [address], [place], [place] [address]  . Methylmalonic Acid, Quantitative 09/19/2017 139  0 - 378 nmol/L Final  . Disclaimer: 09/19/2017 Comment   Final   Comment: (NOTE) This test was developed and its performance characteristics determined by [facility]. It has not been cleared or approved by the Food and Drug Administration. Performed At: [facility] [place], [place] [id number] Rush Farmer MD [id number] Performed at [facility], [place] [address] [place],  [address]   . Vitamin B-12 09/19/2017 314  180 - 914 pg/mL Final   Comment: (NOTE) This assay is not validated for testing neonatal or myeloproliferative syndrome specimens for Vitamin B12 levels. Performed at [facility], [place] [address] [place],  [address]   . Ferritin  09/19/2017 47  9 - 269 ng/mL Final  Performed at [facility], [place] [address], [place], [place] [address]  . Dean 09/19/2017 65  41 - 142 ug/dL Final  . TIBC 09/19/2017 230* 236 - 444 ug/dL Final  . Saturation Ratios 09/19/2017 28  21 - 57 % Final  . UIBC 09/19/2017 165  ug/dL Final   Performed at [facility], [place] [address] [place], [place] [address]  . LDH 09/19/2017 143  125 - 245 U/L Final   Performed at [facility], [place] [address], [place], [place] [address]  . Sodium 09/19/2017 137  136 - 145 mmol/L Final  . Potassium 09/19/2017 3.9  3.5 - 5.1 mmol/L Final  . Chloride 09/19/2017 105  98 - 109 mmol/L Final  . CO2 09/19/2017 28  22 - 29 mmol/L Final  . Glucose, Bld 09/19/2017 81  70 - 140 mg/dL Final  . BUN 09/19/2017 13  7 - 26 mg/dL Final  . Creatinine 09/19/2017 1.26* 0.60 - 1.10 mg/dL Final  . Calcium 09/19/2017 8.8  8.4 - 10.4 mg/dL Final  . Total Protein 09/19/2017 7.1  6.4 - 8.3 g/dL Final  . Albumin 09/19/2017 4.0  3.5 - 5.0 g/dL Final  . AST 09/19/2017 19  5 - 34 U/L Final  . ALT 09/19/2017 14  0 - 55 U/L Final  . Alkaline Phosphatase 09/19/2017 55  40 - 150 U/L Final  . Total Bilirubin 09/19/2017 0.9  0.2 - 1.2 mg/dL Final  . GFR, Est Non Af Am 09/19/2017 52* >60 mL/min Final  . GFR, Est AFR Am 09/19/2017 >60  >60 mL/min Final   Comment: (NOTE) The eGFR has been calculated using the CKD EPI equation. This calculation has not been validated in all clinical situations. eGFR's persistently <60 mL/min signify possible Chronic Kidney Disease.   Georgiann Hahn gap 09/19/2017 4  3 - 11 Final   Performed at [facility], [place] [address] [place], [place] [address]  . WBC Count 09/19/2017 3.4* 3.9 - 10.3 K/uL Final  . RBC 09/19/2017 4.40  3.70 - 5.45 MIL/uL Final  . Hemoglobin 09/19/2017 12.0  11.6 - 15.9 g/dL Final  . HCT 09/19/2017 35.9  34.8 - 46.6 % Final  . MCV 09/19/2017  81.5  79.5 - 101.0 fL Final  . MCH 09/19/2017 27.1  25.1 - 34.0 pg Final  . MCHC 09/19/2017 33.3  31.5 - 36.0 g/dL Final  . RDW 09/19/2017 20.6* 11.2 - 14.5 % Final  . Platelet Count 09/19/2017 218  145 - 400 K/uL Final  . Neutrophils Relative % 09/19/2017 51  % Final  . Neutro Abs 09/19/2017 1.7  1.5 - 6.5 K/uL Final  . Lymphocytes Relative 09/19/2017 31  % Final  . Lymphs Abs 09/19/2017 1.0  0.9 - 3.3 K/uL Final  . Monocytes Relative 09/19/2017 9  % Final  . Monocytes Absolute 09/19/2017 0.3  0.1 - 0.9 K/uL Final  . Eosinophils Relative 09/19/2017 8  % Final  . Eosinophils Absolute 09/19/2017 0.3  0.0 - 0.5 K/uL Final  . Basophils Relative 09/19/2017 1  % Final  . Basophils Absolute 09/19/2017 0.0  0.0 - 0.1 K/uL Final   Performed at [facility], [place] [address], [place], [place] [address]       Ardath Sax, MD

## 2017-10-21 ENCOUNTER — Inpatient Hospital Stay: Payer: 59 | Attending: Hematology and Oncology

## 2017-10-21 NOTE — Progress Notes (Deleted)
[facility]  [phone] [phone]  Clinic Follow up Note   Patient Care Team: Mittie Bodo as PCP - General (Physician Assistant) 10/21/2017   DIAGNOSIS: Iron deficiency anemia  History of present illness:  This patient was previously seen by Dr. Lebron Conners, initially referred by Dr. Harrison Mons, PA-C in October 2018.  Available records indicate anemia dating back to 11/12/2013 with Hgb 10.4 with a ferritin level of 4.  Serum iron level remained low at 23 on 04/01/2015.  Labs on 02/15/2017 indicated a severe microcytic, hypochromic anemia with Hgb 7.4; labs again indicated iron deficiency with ferritin 3, serum iron 11, and iron saturation 3%. She has been on oral iron supplementation intermittently but with poor tolerance. Iron deficiency anemia is attributed to menorrhagia. Dr. Lebron Conners checked erythropoietin level which was 49.1 on 04/01/17, given history of kidney donation. After consultation with hematology she was also found to have mild B12 deficiency, 231 on 04/01/17, with normal folate.  Dr. Lebron Conners restarted her on oral iron supplementation in 04/01/2017, which she initially responded to.  Iron levels were low again by 07/14/2017 with serum iron 27, with 8% saturation, and ferritin of 4. Dr. Lebron Conners Recommended parenteral IV administration which she began on 07/21/2017 with first dose of Feraheme she subsequently received 510 mg IV Feraheme on 07/28/17 and 09/29/2017, goal is to keep ferritin above 50.   CURRENT THERAPY: oral iron supplement, IV Feraheme PRN  INTERVAL HISTORY: Please see below for problem oriented charting.  REVIEW OF SYSTEMS:   Constitutional: Denies fevers, chills or abnormal weight loss Eyes: Denies blurriness of vision Ears, nose, mouth, throat, and face: Denies mucositis or sore throat Respiratory: Denies cough, dyspnea or wheezes Cardiovascular: Denies palpitation, chest discomfort or lower extremity  swelling Gastrointestinal:  Denies nausea, heartburn or change in bowel habits Skin: Denies abnormal skin rashes Lymphatics: Denies new lymphadenopathy or easy bruising Neurological:Denies numbness, tingling or new weaknesses Behavioral/Psych: Mood is stable, no new changes  All other systems were reviewed with the patient and are negative.  MEDICAL HISTORY:  Past Medical History:  Diagnosis Date  . Allergy   . Pernicious anemia     SURGICAL HISTORY: Past Surgical History:  Procedure Laterality Date  . NEPHRECTOMY TRANSPLANTED ORGAN  2011   donated to her mother    I have reviewed the social history and family history with the patient and they are unchanged from previous note.  ALLERGIES:  is allergic to shellfish allergy.  MEDICATIONS:  Current Outpatient Medications  Medication Sig Dispense Refill  . Cyanocobalamin (VITAMIN B-12 PO) Take by mouth daily.    . IRON PO Take 20 mLs by mouth daily.     . norethindrone (MICRONOR,CAMILA,ERRIN) 0.35 MG tablet Take 1 tablet (0.35 mg total) by mouth daily. 3 Package 4   No current facility-administered medications for this visit.     PHYSICAL EXAMINATION: ECOG PERFORMANCE STATUS: {CHL ONC ECOG PS:[id number]}  There were no vitals filed for this visit. There were no vitals filed for this visit.  GENERAL:alert, no distress and comfortable SKIN: skin color, texture, turgor are normal, no rashes or significant lesions EYES: normal, Conjunctiva are pink and non-injected, sclera clear OROPHARYNX:no exudate, no erythema and lips, buccal mucosa, and tongue normal  NECK: supple, thyroid normal size, non-tender, without nodularity LYMPH:  no palpable lymphadenopathy in the cervical, axillary or inguinal LUNGS: clear to auscultation and percussion with normal breathing effort HEART: regular rate & rhythm and no murmurs and no lower extremity edema  ABDOMEN:abdomen soft, non-tender and normal bowel sounds Musculoskeletal:no cyanosis of  digits and no clubbing  NEURO: alert & oriented x 3 with fluent speech, no focal motor/sensory deficits  LABORATORY DATA:  I have reviewed the data as listed CBC Latest Ref Rng & Units 09/19/2017 07/14/2017 06/02/2017  WBC 3.9 - 10.3 K/uL 3.4(L) 5.1 4.1  Hemoglobin 11.6 - 15.9 g/dL 12.0 9.7(L) 9.6(L)  Hematocrit 34.8 - 46.6 % 35.9 31.8(L) 32.0(L)  Platelets 145 - 400 K/uL 218 253 264     CMP Latest Ref Rng & Units 09/19/2017 07/14/2017 04/08/2017  Glucose 70 - 140 mg/dL 81 81 91  BUN 7 - 26 mg/dL 13 9 10.3  Creatinine 0.60 - 1.10 mg/dL 1.26(H) 1.20(H) 1.2(H)  Sodium 136 - 145 mmol/L 137 138 138  Potassium 3.5 - 5.1 mmol/L 3.9 3.6 4.0  Chloride 98 - 109 mmol/L 105 105 -  CO2 22 - 29 mmol/L 28 24 25   Calcium 8.4 - 10.4 mg/dL 8.8 9.0 9.2  Total Protein 6.4 - 8.3 g/dL 7.1 6.9 7.4  Total Bilirubin 0.2 - 1.2 mg/dL 0.9 1.3(H) 0.82  Alkaline Phos 40 - 150 U/L 55 58 59  AST 5 - 34 U/L 19 19 24   ALT 0 - 55 U/L 14 10 19       RADIOGRAPHIC STUDIES: I have personally reviewed the radiological images as listed and agreed with the findings in the report. No results found.   ASSESSMENT & PLAN:  No problem-specific Assessment & Plan notes found for this encounter.   No orders of the defined types were placed in this encounter.  All questions were answered. The patient knows to call the clinic with any problems, questions or concerns. No barriers to learning was detected. I spent {CHL ONC TIME VISIT - UGQBV:[id number]} counseling the patient face to face. The total time spent in the appointment was {CHL ONC TIME VISIT - CMKLK:[id number]} and more than 50% was on counseling and review of test results     Alla Feeling, NP 10/21/17

## 2017-10-23 ENCOUNTER — Inpatient Hospital Stay: Payer: 59 | Admitting: Nurse Practitioner

## 2017-10-23 ENCOUNTER — Inpatient Hospital Stay: Payer: 59

## 2017-10-28 ENCOUNTER — Telehealth: Payer: Self-pay | Admitting: Nurse Practitioner

## 2017-10-28 NOTE — Telephone Encounter (Signed)
Left message on voicemail for patient regarding new appointment date/time/ letter/calendar mailed to patient per 6/14 sch msg

## 2017-11-06 ENCOUNTER — Inpatient Hospital Stay: Payer: 59 | Admitting: Nurse Practitioner

## 2017-11-06 ENCOUNTER — Inpatient Hospital Stay: Payer: 59

## 2017-11-06 NOTE — Progress Notes (Deleted)
[facility]  [phone] [phone]  Clinic Follow up Note   Patient Care Team: Mittie Bodo as PCP - General (Physician Assistant) 11/06/2017  SUMMARY OF ONCOLOGIC HISTORY:  No history exists.    INTERVAL HISTORY: Please see below for problem oriented charting.  REVIEW OF SYSTEMS:   Constitutional: Denies fevers, chills or abnormal weight loss Eyes: Denies blurriness of vision Ears, nose, mouth, throat, and face: Denies mucositis or sore throat Respiratory: Denies cough, dyspnea or wheezes Cardiovascular: Denies palpitation, chest discomfort or lower extremity swelling Gastrointestinal:  Denies nausea, heartburn or change in bowel habits Skin: Denies abnormal skin rashes Lymphatics: Denies new lymphadenopathy or easy bruising Neurological:Denies numbness, tingling or new weaknesses Behavioral/Psych: Mood is stable, no new changes  All other systems were reviewed with the patient and are negative.  MEDICAL HISTORY:  Past Medical History:  Diagnosis Date  . Allergy   . Pernicious anemia     SURGICAL HISTORY: Past Surgical History:  Procedure Laterality Date  . NEPHRECTOMY TRANSPLANTED ORGAN  2011   donated to her mother    I have reviewed the social history and family history with the patient and they are unchanged from previous note.  ALLERGIES:  is allergic to shellfish allergy.  MEDICATIONS:  Current Outpatient Medications  Medication Sig Dispense Refill  . Cyanocobalamin (VITAMIN B-12 PO) Take by mouth daily.    . IRON PO Take 20 mLs by mouth daily.     . norethindrone (MICRONOR,CAMILA,ERRIN) 0.35 MG tablet Take 1 tablet (0.35 mg total) by mouth daily. 3 Package 4   No current facility-administered medications for this visit.     PHYSICAL EXAMINATION: ECOG PERFORMANCE STATUS: {CHL ONC ECOG PS:[id number]}  There were no vitals filed for this visit. There were no vitals filed for this visit.  GENERAL:alert,  no distress and comfortable SKIN: skin color, texture, turgor are normal, no rashes or significant lesions EYES: normal, Conjunctiva are pink and non-injected, sclera clear OROPHARYNX:no exudate, no erythema and lips, buccal mucosa, and tongue normal  NECK: supple, thyroid normal size, non-tender, without nodularity LYMPH:  no palpable lymphadenopathy in the cervical, axillary or inguinal LUNGS: clear to auscultation and percussion with normal breathing effort HEART: regular rate & rhythm and no murmurs and no lower extremity edema ABDOMEN:abdomen soft, non-tender and normal bowel sounds Musculoskeletal:no cyanosis of digits and no clubbing  NEURO: alert & oriented x 3 with fluent speech, no focal motor/sensory deficits  LABORATORY DATA:  I have reviewed the data as listed CBC Latest Ref Rng & Units 09/19/2017 07/14/2017 06/02/2017  WBC 3.9 - 10.3 K/uL 3.4(L) 5.1 4.1  Hemoglobin 11.6 - 15.9 g/dL 12.0 9.7(L) 9.6(L)  Hematocrit 34.8 - 46.6 % 35.9 31.8(L) 32.0(L)  Platelets 145 - 400 K/uL 218 253 264     CMP Latest Ref Rng & Units 09/19/2017 07/14/2017 04/08/2017  Glucose 70 - 140 mg/dL 81 81 91  BUN 7 - 26 mg/dL 13 9 10.3  Creatinine 0.60 - 1.10 mg/dL 1.26(H) 1.20(H) 1.2(H)  Sodium 136 - 145 mmol/L 137 138 138  Potassium 3.5 - 5.1 mmol/L 3.9 3.6 4.0  Chloride 98 - 109 mmol/L 105 105 -  CO2 22 - 29 mmol/L 28 24 25   Calcium 8.4 - 10.4 mg/dL 8.8 9.0 9.2  Total Protein 6.4 - 8.3 g/dL 7.1 6.9 7.4  Total Bilirubin 0.2 - 1.2 mg/dL 0.9 1.3(H) 0.82  Alkaline Phos 40 - 150 U/L 55 58 59  AST 5 - 34 U/L 19 19  24  ALT 0 - 55 U/L 14 10 19       RADIOGRAPHIC STUDIES: I have personally reviewed the radiological images as listed and agreed with the findings in the report. No results found.   ASSESSMENT & PLAN:  No problem-specific Assessment & Plan notes found for this encounter.   No orders of the defined types were placed in this encounter.  All questions were answered. The patient knows to  call the clinic with any problems, questions or concerns. No barriers to learning was detected. I spent {CHL ONC TIME VISIT - ONGEX:[id number]} counseling the patient face to face. The total time spent in the appointment was {CHL ONC TIME VISIT - NUUVO:[id number]} and more than 50% was on counseling and review of test results     Alla Feeling, NP 11/06/17

## 2017-11-07 ENCOUNTER — Telehealth: Payer: Self-pay | Admitting: Nurse Practitioner

## 2017-11-07 NOTE — Telephone Encounter (Signed)
Called patient per 6/27 sch message - left message for patient to call back and r./s missed appt  per her convenience  - pt already no showed twice.

## 2018-09-20 ENCOUNTER — Other Ambulatory Visit: Payer: Self-pay | Admitting: Obstetrics & Gynecology

## 2018-09-21 ENCOUNTER — Other Ambulatory Visit: Payer: Self-pay

## 2018-09-23 ENCOUNTER — Ambulatory Visit (INDEPENDENT_AMBULATORY_CARE_PROVIDER_SITE_OTHER): Payer: BLUE CROSS/BLUE SHIELD | Admitting: Obstetrics & Gynecology

## 2018-09-23 ENCOUNTER — Encounter: Payer: Self-pay | Admitting: Obstetrics & Gynecology

## 2018-09-23 ENCOUNTER — Other Ambulatory Visit: Payer: Self-pay

## 2018-09-23 VITALS — BP 124/72 | Ht 66.0 in | Wt 156.0 lb

## 2018-09-23 DIAGNOSIS — N921 Excessive and frequent menstruation with irregular cycle: Secondary | ICD-10-CM

## 2018-09-23 DIAGNOSIS — Z3041 Encounter for surveillance of contraceptive pills: Secondary | ICD-10-CM | POA: Diagnosis not present

## 2018-09-23 DIAGNOSIS — D250 Submucous leiomyoma of uterus: Secondary | ICD-10-CM | POA: Diagnosis not present

## 2018-09-23 DIAGNOSIS — Z01419 Encounter for gynecological examination (general) (routine) without abnormal findings: Secondary | ICD-10-CM

## 2018-09-23 DIAGNOSIS — R87610 Atypical squamous cells of undetermined significance on cytologic smear of cervix (ASC-US): Secondary | ICD-10-CM | POA: Diagnosis not present

## 2018-09-23 MED ORDER — NORETHINDRONE 0.35 MG PO TABS: 1.0000 | ORAL_TABLET | Freq: Every day | ORAL | 4 refills | Status: DC

## 2018-09-23 NOTE — Progress Notes (Signed)
Rita Dean October 12, 1975 [id number]   History:    43 y.o. G4P4L3 Married.  Patient's mother passed away last year.  RP:  Established patient presenting for annual gyn exam   HPI: Patient was diagnosed with a submucosal myoma 3.4 cm by sonohysterogram in March 2019.  A hysteroscopy with MyoSure excision and D&C was schedule but not performed yet because of costs and personal reasons as her mother died around that time last year.  Patient was well on the progestin only pill until recently when she started having heavy periods and frequent metrorrhagia.  Patient is taking iron supplements.  No pelvic pain currently.  No pain with intercourse.  Frequently starts spotting after intercourse.  Urine and bowel movements normal.  Breast normal.  Body mass index 25.18.  Patient is physically active on a regular basis.  Healthy nutrition.  Past medical history,surgical history, family history and social history were all reviewed and documented in the EPIC chart.  Gynecologic History Patient's last menstrual period was 09/11/2018. Contraception: oral progesterone-only contraceptive Last Pap: 02/2017. Results were: ASCUS/HPV HR negative.  Gono-Chlam negative Last mammogram: Never.  [facility]. Bone Density: Never Colonoscopy: Never  Obstetric History OB History  Gravida Para Term Preterm AB Living  4 4       3   SAB TAB Ectopic Multiple Live Births               # Outcome Date GA Lbr Len/2nd Weight Sex Delivery Anes PTL Lv  4 Para           3 Para           2 Para           1 Para              ROS: A ROS was performed and pertinent positives and negatives are included in the history.  GENERAL: No fevers or chills. HEENT: No change in vision, no earache, sore throat or sinus congestion. NECK: No pain or stiffness. CARDIOVASCULAR: No chest pain or pressure. No palpitations. PULMONARY: No shortness of breath, cough or wheeze. GASTROINTESTINAL: No abdominal pain,  nausea, vomiting or diarrhea, melena or bright red blood per rectum. GENITOURINARY: No urinary frequency, urgency, hesitancy or dysuria. MUSCULOSKELETAL: No joint or muscle pain, no back pain, no recent trauma. DERMATOLOGIC: No rash, no itching, no lesions. ENDOCRINE: No polyuria, polydipsia, no heat or cold intolerance. No recent change in weight. HEMATOLOGICAL: No anemia or easy bruising or bleeding. NEUROLOGIC: No headache, seizures, numbness, tingling or weakness. PSYCHIATRIC: No depression, no loss of interest in normal activity or change in sleep pattern.     Exam:   BP 124/72   Ht 5\' 6"  (1.676 m)   Wt 156 lb (70.8 kg)   LMP 09/11/2018   BMI 25.18 kg/m   Body mass index is 25.18 kg/m.  General appearance : Well developed well nourished female. No acute distress HEENT: Eyes: no retinal hemorrhage or exudates,  Neck supple, trachea midline, no carotid bruits, no thyroidmegaly Lungs: Clear to auscultation, no rhonchi or wheezes, or rib retractions  Heart: Regular rate and rhythm, no murmurs or gallops Breast:Examined in sitting and supine position were symmetrical in appearance, no palpable masses or tenderness,  no skin retraction, no nipple inversion, no nipple discharge, no skin discoloration, no axillary or supraclavicular lymphadenopathy Abdomen: no palpable masses or tenderness, no rebound or guarding Extremities: no edema or skin discoloration or tenderness  Pelvic: Vulva: Normal  Vagina: No gross lesions or discharge  Cervix: No gross lesions or discharge.  Pap reflex done.  Uterus  AV, mild increase in size, nodular, non-tender and mobile  Adnexa  Without masses or tenderness  Anus: Normal   Assessment/Plan:  43 y.o. female for annual exam   1. Encounter for routine gynecological examination with Papanicolaou smear of cervix Gynecologic exam with uterine fibroids.  Pap test with high ASCUS and high-risk HPV negative in October 2018.  Pap test reflex done  today.  Breast exam normal.  No screening mammogram done yet, patient will call the breast center to organize.  Good body mass index at 25.18.  Continue with fitness and healthy nutrition.  2. Encounter for surveillance of contraceptive pills We will continue on the progesterone only birth control pill.  No contraindication.  Prescription sent to pharmacy.  3. Menometrorrhagia Menometrorrhagia probably secondary to a submucosal fibroid measured at 3.4 cm by sonohysterogram in March 2019.  Surgery was planned but not done yet for financial reason and personal reasons.  We will repeat a CBC today and a TSH.  Patient will follow-up to reassess the size of the submucosal fibroid by pelvic ultrasound.  Will schedule surgery per pelvic ultrasound findings, if possible, patient would prefer a hysteroscopy with excision of the submucosal fibroid and D&C as was planned last year. - CBC - TSH - US Transvaginal Non-OB; Future  4. Submucous uterine fibroid As above. - US Transvaginal Non-OB; Future  Other orders - norethindrone (MICRONOR) 0.35 MG tablet; Take 1 tablet (0.35 mg total) by mouth daily.  Counseling on above issues and coordination of care more than 50% for 10 minutes.  Princess Bruins MD, 2:21 PM 09/23/2018

## 2018-09-23 NOTE — Patient Instructions (Signed)
1. Encounter for routine gynecological examination with Papanicolaou smear of cervix Gynecologic exam with uterine fibroids.  Pap test with high ASCUS and high-risk HPV negative in October 2018.  Pap test reflex done today.  Breast exam normal.  No screening mammogram done yet, patient will call the breast center to organize.  Good body mass index at 25.18.  Continue with fitness and healthy nutrition.  2. Encounter for surveillance of contraceptive pills We will continue on the progesterone only birth control pill.  No contraindication.  Prescription sent to pharmacy.  3. Menometrorrhagia Menometrorrhagia probably secondary to a submucosal fibroid measured at 3.4 cm by sonohysterogram in March 2019.  Surgery was planned but not done yet for financial reason and personal reasons.  We will repeat a CBC today and a TSH.  Patient will follow-up to reassess the size of the submucosal fibroid by pelvic ultrasound.  Will schedule surgery per pelvic ultrasound findings, if possible, patient would prefer a hysteroscopy with excision of the submucosal fibroid and D&C as was planned last year. - CBC - TSH - US Transvaginal Non-OB; Future  4. Submucous uterine fibroid As above. - US Transvaginal Non-OB; Future  Other orders - norethindrone (MICRONOR) 0.35 MG tablet; Take 1 tablet (0.35 mg total) by mouth daily.  Khyla, it was a pleasure seeing you today!  I will inform you of your results as soon as they are available.

## 2018-09-23 NOTE — Addendum Note (Signed)
Addended by: Thurnell Garbe A on: 09/23/2018 03:08 PM   Modules accepted: Orders

## 2018-09-24 LAB — CBC
HCT: 29.4 % — ABNORMAL LOW (ref 35.0–45.0)
Hemoglobin: 9.4 g/dL — ABNORMAL LOW (ref 11.7–15.5)
MCH: 25.6 pg — ABNORMAL LOW (ref 27.0–33.0)
MCHC: 32 g/dL (ref 32.0–36.0)
MCV: 80.1 fL (ref 80.0–100.0)
MPV: 11 fL (ref 7.5–12.5)
Platelets: 345 10*3/uL (ref 140–400)
RBC: 3.67 10*6/uL — ABNORMAL LOW (ref 3.80–5.10)
RDW: 13.6 % (ref 11.0–15.0)
WBC: 5.2 10*3/uL (ref 3.8–10.8)

## 2018-09-24 LAB — TSH: TSH: 1.04 mIU/L

## 2018-09-25 LAB — PAP IG W/ RFLX HPV ASCU

## 2018-09-25 LAB — HUMAN PAPILLOMAVIRUS, HIGH RISK: HPV DNA High Risk: NOT DETECTED

## 2018-10-08 ENCOUNTER — Other Ambulatory Visit: Payer: Self-pay

## 2018-10-08 ENCOUNTER — Ambulatory Visit (INDEPENDENT_AMBULATORY_CARE_PROVIDER_SITE_OTHER): Payer: BLUE CROSS/BLUE SHIELD | Admitting: Obstetrics & Gynecology

## 2018-10-08 ENCOUNTER — Encounter: Payer: Self-pay | Admitting: Obstetrics & Gynecology

## 2018-10-08 ENCOUNTER — Ambulatory Visit (INDEPENDENT_AMBULATORY_CARE_PROVIDER_SITE_OTHER): Payer: BLUE CROSS/BLUE SHIELD

## 2018-10-08 VITALS — BP 122/70

## 2018-10-08 DIAGNOSIS — D250 Submucous leiomyoma of uterus: Secondary | ICD-10-CM

## 2018-10-08 DIAGNOSIS — N921 Excessive and frequent menstruation with irregular cycle: Secondary | ICD-10-CM | POA: Diagnosis not present

## 2018-10-08 NOTE — Progress Notes (Signed)
    Rita Dean [birth year] [id number]        43 y.o.  G4P4L3 Married  RP: Menometrorrhagia/Fibroids for Pelvic US and Preop visit  HPI: Menometrorrhagia on the Progestin-only BCPs.  Sonohysto SM Myoma 07/2017, surgey not performed yet because patient's mother died and cost of surgery.  Ready to proceed with surgery now.   OB History  Gravida Para Term Preterm AB Living  4 4       3   SAB TAB Ectopic Multiple Live Births               # Outcome Date GA Lbr Len/2nd Weight Sex Delivery Anes PTL Lv  4 Para           3 Para           2 Para           1 Para             Past medical history,surgical history, problem list, medications, allergies, family history and social history were all reviewed and documented in the EPIC chart.   Directed ROS with pertinent positives and negatives documented in the history of present illness/assessment and plan.  Exam:  Vitals:   10/08/18 0955  BP: 122/70   General appearance:  Normal  Pelvic US today: T/V images.  Uterus and fibroid slightly smaller than on previous scan.  Uterus measured at 11.77 x 10.27 x 7.96 cm.  Largest intramural fibroid measured at 4.4 x 3.6 cm.  Submucosal fibroid measured at 3.2 x 2.6 cm.  The Endometrial line is distorted by the submucosal fibroid and is measured at 5.1 mm.  Left ovary normal.  Right ovary with 3 avascular simple follicles measured at 2.8 x 2.1 cm, 2.0 x 1.6 cm, 1.6 x 1.5 cm.  No free fluid in the posterior cul-de-sac.   Assessment/Plan:  43 y.o. G4P4   1. Submucous myoma of uterus Menometrorrhagia with a SM Myoma stable in size x last year, measured at 3.2 cm.  Patient is now ready to proceed with a [place] Excision of SM Myoma with XL Myosure, D+C.  Surgical procedure, risks and benefits, preop/postop precautions and care thoroughly reviewed with patient.  Patient voiced understanding and agreement.  2. Menometrorrhagia As above.  Counseling on above issues and coordination of care more than 50% for  15 minutes.  Princess Bruins MD, 10:03 AM 10/08/2018

## 2018-10-08 NOTE — Patient Instructions (Signed)
1. Submucous myoma of uterus Menometrorrhagia with a SM Myoma stable in size x last year, measured at 3.2 cm.  Patient is now ready to proceed with a [place] Excision of SM Myoma with XL Myosure, D+C.  Surgical procedure, risks and benefits, preop/postop precautions and care thoroughly reviewed with patient.  Patient voiced understanding and agreement.  2. Menometrorrhagia As above.  Rita Dean, it was a pleasure seeing you today!

## 2018-10-13 ENCOUNTER — Encounter: Payer: Self-pay | Admitting: Anesthesiology

## 2018-10-21 ENCOUNTER — Ambulatory Visit: Payer: BC Managed Care – PPO | Admitting: Obstetrics & Gynecology

## 2018-10-26 ENCOUNTER — Telehealth: Payer: Self-pay | Admitting: *Deleted

## 2018-10-26 MED ORDER — MEGESTROL ACETATE 40 MG PO TABS: 40.0000 mg | ORAL_TABLET | Freq: Two times a day (BID) | ORAL | 0 refills | Status: DC

## 2018-10-26 NOTE — Telephone Encounter (Signed)
Please add Megace 40 mg BID until surgery.

## 2018-10-26 NOTE — Telephone Encounter (Signed)
Patient informed, Rx sent.  

## 2018-10-26 NOTE — Telephone Encounter (Signed)
Patient scheduled D&C Friday c/o heavy bleeding, changing pad every 1 hour, cycle started on 10/19/18. She asked if okay to bleed this heavy with surgery scheduled? Please advise

## 2018-10-27 ENCOUNTER — Other Ambulatory Visit (HOSPITAL_COMMUNITY)
Admission: RE | Admit: 2018-10-27 | Discharge: 2018-10-27 | Disposition: A | Discharge status: Home / Self Care | Payer: BC Managed Care – PPO | Source: Ambulatory Visit | Attending: Obstetrics & Gynecology | Admitting: Obstetrics & Gynecology

## 2018-10-27 ENCOUNTER — Encounter (HOSPITAL_COMMUNITY)
Admission: RE | Admit: 2018-10-27 | Discharge: 2018-10-27 | Disposition: A | Discharge status: Home / Self Care | Payer: BC Managed Care – PPO | Source: Ambulatory Visit | Attending: Obstetrics & Gynecology | Admitting: Obstetrics & Gynecology

## 2018-10-27 ENCOUNTER — Other Ambulatory Visit: Payer: Self-pay

## 2018-10-27 DIAGNOSIS — Z01812 Encounter for preprocedural laboratory examination: Secondary | ICD-10-CM | POA: Insufficient documentation

## 2018-10-27 DIAGNOSIS — Z1159 Encounter for screening for other viral diseases: Secondary | ICD-10-CM | POA: Diagnosis not present

## 2018-10-27 LAB — BASIC METABOLIC PANEL
Anion gap: 10 (ref 5–15)
BUN: 14 mg/dL (ref 6–20)
CO2: 23 mmol/L (ref 22–32)
Calcium: 8.5 mg/dL — ABNORMAL LOW (ref 8.9–10.3)
Chloride: 107 mmol/L (ref 98–111)
Creatinine, Ser: 1.11 mg/dL — ABNORMAL HIGH (ref 0.44–1.00)
GFR calc Af Amer: >60 mL/min (ref >60)
GFR calc non Af Amer: >60 mL/min (ref >60)
Glucose, Bld: 90 mg/dL (ref 70–99)
Potassium: 4 mmol/L (ref 3.5–5.1)
Sodium: 140 mmol/L (ref 135–145)

## 2018-10-27 LAB — CBC
HCT: 29.7 % — ABNORMAL LOW (ref 36.0–46.0)
Hemoglobin: 8.8 g/dL — ABNORMAL LOW (ref 12.0–15.0)
MCH: 24 pg — ABNORMAL LOW (ref 26.0–34.0)
MCHC: 29.6 g/dL — ABNORMAL LOW (ref 30.0–36.0)
MCV: 80.9 fL (ref 80.0–100.0)
Platelets: 301 10*3/uL (ref 150–400)
RBC: 3.67 MIL/uL — ABNORMAL LOW (ref 3.87–5.11)
RDW: 14 % (ref 11.5–15.5)
WBC: 4.8 10*3/uL (ref 4.0–10.5)
nRBC: 0 % (ref 0.0–0.2)

## 2018-10-27 LAB — ABO/RH: ABO/RH(D): O POS

## 2018-10-28 ENCOUNTER — Other Ambulatory Visit (HOSPITAL_COMMUNITY): Payer: Self-pay

## 2018-10-28 LAB — NOVEL CORONAVIRUS, NAA (HOSP ORDER, SEND-OUT TO REF LAB; TAT 18-24 HRS): SARS-CoV-2, NAA: NOT DETECTED

## 2018-10-29 ENCOUNTER — Encounter (HOSPITAL_BASED_OUTPATIENT_CLINIC_OR_DEPARTMENT_OTHER): Payer: Self-pay | Admitting: *Deleted

## 2018-10-29 NOTE — Progress Notes (Signed)
Unable to reach pt after multiple attempts leaving messages.   Left voice message on pt phone to arrive at 10-30-2018 @ 0545 and to be npo after mn, absolutely nothing by mouth.  Bring insurance card and photo ID.  Informed of visitor restriction and wear mask.  Pt had lab work done 10-27-2018 ,including covid test,  results in epic and chart.   Pt will needs urine preg,  Medical history and medication list completed in pre-op.

## 2018-10-30 ENCOUNTER — Ambulatory Visit (HOSPITAL_BASED_OUTPATIENT_CLINIC_OR_DEPARTMENT_OTHER)
Admission: RE | Admit: 2018-10-30 | Discharge: 2018-10-30 | Disposition: A | Discharge status: Home / Self Care | Payer: BC Managed Care – PPO | Attending: Obstetrics & Gynecology | Admitting: Obstetrics & Gynecology

## 2018-10-30 ENCOUNTER — Encounter (HOSPITAL_BASED_OUTPATIENT_CLINIC_OR_DEPARTMENT_OTHER): Payer: Self-pay

## 2018-10-30 ENCOUNTER — Other Ambulatory Visit: Payer: Self-pay

## 2018-10-30 ENCOUNTER — Ambulatory Visit (HOSPITAL_BASED_OUTPATIENT_CLINIC_OR_DEPARTMENT_OTHER): Payer: BC Managed Care – PPO | Admitting: Anesthesiology

## 2018-10-30 ENCOUNTER — Encounter (HOSPITAL_BASED_OUTPATIENT_CLINIC_OR_DEPARTMENT_OTHER)
Admission: RE | Disposition: A | Discharge status: Home / Self Care | Payer: Self-pay | Source: Home / Self Care | Attending: Obstetrics & Gynecology

## 2018-10-30 DIAGNOSIS — Z91013 Allergy to seafood: Secondary | ICD-10-CM | POA: Insufficient documentation

## 2018-10-30 DIAGNOSIS — Z905 Acquired absence of kidney: Secondary | ICD-10-CM | POA: Diagnosis not present

## 2018-10-30 DIAGNOSIS — Z524 Kidney donor: Secondary | ICD-10-CM | POA: Diagnosis not present

## 2018-10-30 DIAGNOSIS — Z841 Family history of disorders of kidney and ureter: Secondary | ICD-10-CM | POA: Insufficient documentation

## 2018-10-30 DIAGNOSIS — Z79899 Other long term (current) drug therapy: Secondary | ICD-10-CM | POA: Diagnosis not present

## 2018-10-30 DIAGNOSIS — D259 Leiomyoma of uterus, unspecified: Secondary | ICD-10-CM

## 2018-10-30 DIAGNOSIS — N920 Excessive and frequent menstruation with regular cycle: Secondary | ICD-10-CM | POA: Diagnosis not present

## 2018-10-30 DIAGNOSIS — Z8249 Family history of ischemic heart disease and other diseases of the circulatory system: Secondary | ICD-10-CM | POA: Insufficient documentation

## 2018-10-30 DIAGNOSIS — Z833 Family history of diabetes mellitus: Secondary | ICD-10-CM | POA: Diagnosis not present

## 2018-10-30 DIAGNOSIS — N921 Excessive and frequent menstruation with irregular cycle: Secondary | ICD-10-CM

## 2018-10-30 DIAGNOSIS — Z8042 Family history of malignant neoplasm of prostate: Secondary | ICD-10-CM | POA: Diagnosis not present

## 2018-10-30 DIAGNOSIS — Z823 Family history of stroke: Secondary | ICD-10-CM | POA: Diagnosis not present

## 2018-10-30 DIAGNOSIS — E538 Deficiency of other specified B group vitamins: Secondary | ICD-10-CM | POA: Diagnosis not present

## 2018-10-30 DIAGNOSIS — D509 Iron deficiency anemia, unspecified: Secondary | ICD-10-CM | POA: Insufficient documentation

## 2018-10-30 DIAGNOSIS — D500 Iron deficiency anemia secondary to blood loss (chronic): Secondary | ICD-10-CM | POA: Diagnosis not present

## 2018-10-30 DIAGNOSIS — D250 Submucous leiomyoma of uterus: Secondary | ICD-10-CM | POA: Diagnosis not present

## 2018-10-30 HISTORY — DX: Iron deficiency anemia secondary to blood loss (chronic): D50.0

## 2018-10-30 HISTORY — DX: Submucous leiomyoma of uterus: D25.0

## 2018-10-30 HISTORY — DX: Excessive and frequent menstruation with irregular cycle: N92.1

## 2018-10-30 HISTORY — PX: DILATATION & CURETTAGE/HYSTEROSCOPY WITH MYOSURE: SHX6511

## 2018-10-30 LAB — POCT PREGNANCY, URINE: Preg Test, Ur: NEGATIVE

## 2018-10-30 LAB — TYPE AND SCREEN
ABO/RH(D): O POS
Antibody Screen: NEGATIVE

## 2018-10-30 SURGERY — DILATATION & CURETTAGE/HYSTEROSCOPY WITH MYOSURE
Anesthesia: General | Site: Vagina

## 2018-10-30 MED ORDER — FENTANYL CITRATE (PF) 100 MCG/2ML IJ SOLN
INTRAMUSCULAR | Status: DC | PRN
  Administered 2018-10-30: 50 ug via INTRAVENOUS

## 2018-10-30 MED ORDER — LIDOCAINE 2% (20 MG/ML) 5 ML SYRINGE
INTRAMUSCULAR | Status: DC | PRN
  Administered 2018-10-30: 60 mg via INTRAVENOUS

## 2018-10-30 MED ORDER — SODIUM CHLORIDE 0.9 % IR SOLN
Status: DC | PRN
  Administered 2018-10-30: 3000 mL

## 2018-10-30 MED ORDER — PHENYLEPHRINE HCL (PRESSORS) 10 MG/ML IV SOLN
INTRAVENOUS | Status: DC | PRN
  Administered 2018-10-30: 80 ug via INTRAVENOUS
  Administered 2018-10-30: 40 ug via INTRAVENOUS

## 2018-10-30 MED ORDER — OXYCODONE HCL 5 MG PO TABS
5.0000 mg | ORAL_TABLET | Freq: Once | ORAL | Status: DC | PRN
  Filled 2018-10-30: qty 1

## 2018-10-30 MED ORDER — PROPOFOL 10 MG/ML IV BOLUS
INTRAVENOUS | Status: DC | PRN
  Administered 2018-10-30: 150 mg via INTRAVENOUS

## 2018-10-30 MED ORDER — WHITE PETROLATUM EX OINT
TOPICAL_OINTMENT | CUTANEOUS | Status: AC
  Filled 2018-10-30: qty 5

## 2018-10-30 MED ORDER — DEXAMETHASONE SODIUM PHOSPHATE 10 MG/ML IJ SOLN
INTRAMUSCULAR | Status: AC
  Filled 2018-10-30: qty 1

## 2018-10-30 MED ORDER — MIDAZOLAM HCL 2 MG/2ML IJ SOLN
INTRAMUSCULAR | Status: AC
  Filled 2018-10-30: qty 2

## 2018-10-30 MED ORDER — LIDOCAINE HCL 1 % IJ SOLN
INTRAMUSCULAR | Status: DC | PRN
  Administered 2018-10-30: 20 mL

## 2018-10-30 MED ORDER — CEFAZOLIN SODIUM-DEXTROSE 2-4 GM/100ML-% IV SOLN
2.0000 g | INTRAVENOUS | Status: AC
  Administered 2018-10-30: 2 g via INTRAVENOUS
  Filled 2018-10-30: qty 100

## 2018-10-30 MED ORDER — MIDAZOLAM HCL 2 MG/2ML IJ SOLN
INTRAMUSCULAR | Status: DC | PRN
  Administered 2018-10-30: 2 mg via INTRAVENOUS

## 2018-10-30 MED ORDER — PHENYLEPHRINE 40 MCG/ML (10ML) SYRINGE FOR IV PUSH (FOR BLOOD PRESSURE SUPPORT)
PREFILLED_SYRINGE | INTRAVENOUS | Status: AC
  Filled 2018-10-30: qty 10

## 2018-10-30 MED ORDER — ONDANSETRON HCL 4 MG/2ML IJ SOLN
INTRAMUSCULAR | Status: DC | PRN
  Administered 2018-10-30: 4 mg via INTRAVENOUS

## 2018-10-30 MED ORDER — FENTANYL CITRATE (PF) 100 MCG/2ML IJ SOLN
INTRAMUSCULAR | Status: AC
  Filled 2018-10-30: qty 2

## 2018-10-30 MED ORDER — KETOROLAC TROMETHAMINE 30 MG/ML IJ SOLN
INTRAMUSCULAR | Status: DC | PRN
  Administered 2018-10-30: 30 mg via INTRAVENOUS

## 2018-10-30 MED ORDER — OXYCODONE HCL 5 MG/5ML PO SOLN
5.0000 mg | Freq: Once | ORAL | Status: DC | PRN
  Filled 2018-10-30: qty 5

## 2018-10-30 MED ORDER — LIDOCAINE 2% (20 MG/ML) 5 ML SYRINGE
INTRAMUSCULAR | Status: AC
  Filled 2018-10-30: qty 5

## 2018-10-30 MED ORDER — CEFAZOLIN SODIUM-DEXTROSE 2-4 GM/100ML-% IV SOLN
INTRAVENOUS | Status: AC
  Filled 2018-10-30: qty 100

## 2018-10-30 MED ORDER — ONDANSETRON HCL 4 MG/2ML IJ SOLN
INTRAMUSCULAR | Status: AC
  Filled 2018-10-30: qty 2

## 2018-10-30 MED ORDER — FENTANYL CITRATE (PF) 100 MCG/2ML IJ SOLN
25.0000 ug | INTRAMUSCULAR | Status: DC | PRN
  Administered 2018-10-30 (×2): 25 ug via INTRAVENOUS
  Filled 2018-10-30: qty 1

## 2018-10-30 MED ORDER — ONDANSETRON HCL 4 MG/2ML IJ SOLN
4.0000 mg | Freq: Once | INTRAMUSCULAR | Status: DC | PRN
  Filled 2018-10-30: qty 2

## 2018-10-30 MED ORDER — KETOROLAC TROMETHAMINE 30 MG/ML IJ SOLN
INTRAMUSCULAR | Status: AC
  Filled 2018-10-30: qty 1

## 2018-10-30 MED ORDER — PROPOFOL 10 MG/ML IV BOLUS
INTRAVENOUS | Status: AC
  Filled 2018-10-30: qty 40

## 2018-10-30 MED ORDER — LACTATED RINGERS IV SOLN
INTRAVENOUS | Status: DC
  Administered 2018-10-30: 125 mL/h via INTRAVENOUS
  Filled 2018-10-30 (×2): qty 1000

## 2018-10-30 MED ORDER — SODIUM CHLORIDE 0.9 % IV SOLN
INTRAVENOUS | Status: DC
  Filled 2018-10-30: qty 1000

## 2018-10-30 MED ORDER — DEXAMETHASONE SODIUM PHOSPHATE 10 MG/ML IJ SOLN
INTRAMUSCULAR | Status: DC | PRN
  Administered 2018-10-30: 5 mg via INTRAVENOUS

## 2018-10-30 SURGICAL SUPPLY — 23 items
CANISTER SUCT 3000ML PPV (MISCELLANEOUS) ×2 IMPLANT
CATH ROBINSON RED A/P 16FR (CATHETERS) ×2 IMPLANT
COVER WAND RF STERILE (DRAPES) ×2 IMPLANT
DEVICE MYOSURE LITE (MISCELLANEOUS) IMPLANT
DEVICE MYOSURE REACH (MISCELLANEOUS) IMPLANT
DILATOR CANAL MILEX (MISCELLANEOUS) IMPLANT
ELECT REM PT RETURN 9FT ADLT (ELECTROSURGICAL)
ELECTRODE REM PT RTRN 9FT ADLT (ELECTROSURGICAL) IMPLANT
GAUZE 4X4 16PLY RFD (DISPOSABLE) ×2 IMPLANT
GLOVE BIO SURGEON STRL SZ 6.5 (GLOVE) ×2 IMPLANT
GLOVE BIOGEL PI IND STRL 7.0 (GLOVE) ×3 IMPLANT
GLOVE BIOGEL PI INDICATOR 7.0 (GLOVE) ×3
GOWN STRL REUS W/TWL LRG LVL3 (GOWN DISPOSABLE) ×4 IMPLANT
IV NS IRRIG 3000ML ARTHROMATIC (IV SOLUTION) ×2 IMPLANT
KIT PROCEDURE FLUENT (KITS) ×2 IMPLANT
MYOSURE XL FIBROID (MISCELLANEOUS) ×2
PACK VAGINAL MINOR WOMEN LF (CUSTOM PROCEDURE TRAY) ×2 IMPLANT
PAD OB MATERNITY 4.3X12.25 (PERSONAL CARE ITEMS) ×2 IMPLANT
PAD PREP 24X48 CUFFED NSTRL (MISCELLANEOUS) ×2 IMPLANT
SEAL CERVICAL OMNI LOK (ABLATOR) IMPLANT
SEAL ROD LENS SCOPE MYOSURE (ABLATOR) ×2 IMPLANT
SYSTEM TISS REMOVAL MYOSURE XL (MISCELLANEOUS) ×1 IMPLANT
TOWEL OR 17X26 10 PK STRL BLUE (TOWEL DISPOSABLE) ×2 IMPLANT

## 2018-10-30 NOTE — H&P (Signed)
Rita Dean is an 43 y.o. female. G72P4L3 Married  RP: Menometrorrhagia with SM Fibroid for [facility] Myosure resection and D+C  HPI: Decreased vaginal bleeding on Megace.  Menometrorrhagia on the Progestin-only BCPs. Sonohysto SM Myoma 07/2017, surgey not performed yet because patient's mother died and cost of surgery.  Pelvic US 10/08/2018 SM Myoma 3.2 cm.  Ready to proceed with surgery now.  Pertinent Gynecological History: Menses: Menometro Contraception: oral progesterone-only contraceptive Blood transfusions: none Sexually transmitted diseases: no past history Last pap: normal  OB History: G4P4L3   Menstrual History: No LMP recorded. (Menstrual status: Irregular Periods).    Past Medical History:  Diagnosis Date  . Iron deficiency anemia due to chronic blood loss hemo/ oncology--- dr Jerilynn Mages. Lebron Conners   heavy menstrual bleeding--- treatment iron infusion and oral iron  . Menometrorrhagia   . Pernicious anemia   . Solitary right kidney 04/12/2010   pt donated left kidney to mother  . Submucous myoma of uterus     Past Surgical History:  Procedure Laterality Date  . ESOPHAGOGASTRODUODENOSCOPY (EGD) WITH PROPOFOL  06-30-2017    dr Ardis Hughs  . LAPAROSCOPIC DONOR NEPHRECTOMY Left 12-01-201  @[email or web address]    donated to mother    Family History  Problem Relation Age of Onset  . Diabetes Mother   . Hypertension Mother   . Kidney disease Mother   . Seizures Sister   . Asthma Son        childhood  . Diabetes Maternal Grandmother   . Stroke Maternal Grandmother   . Prostate cancer Maternal Grandfather   . Colon cancer Neg Hx   . Esophageal cancer Neg Hx   . Liver disease Neg Hx     Social History:  reports that she has never smoked. She has never used smokeless tobacco. She reports that she does not drink alcohol or use drugs.  Allergies:  Allergies  Allergen Reactions  . Shellfish Allergy Rash    Medications Prior to Admission  Medication Sig Dispense Refill Last Dose  .  Cyanocobalamin (VITAMIN B-12 PO) Take by mouth daily.   10/29/2018 at Unknown time  . IRON PO Take 20 mLs by mouth daily.    10/29/2018 at Unknown time  . megestrol (MEGACE) 40 MG tablet Take 1 tablet (40 mg total) by mouth 2 (two) times daily. 10 tablet 0 10/29/2018 at Unknown time  . norethindrone (MICRONOR) 0.35 MG tablet Take 1 tablet (0.35 mg total) by mouth daily. 3 Package 4 10/30/2018 at 0430    REVIEW OF SYSTEMS: A ROS was performed and pertinent positives and negatives are included in the history.  GENERAL: No fevers or chills. HEENT: No change in vision, no earache, sore throat or sinus congestion. NECK: No pain or stiffness. CARDIOVASCULAR: No chest pain or pressure. No palpitations. PULMONARY: No shortness of breath, cough or wheeze. GASTROINTESTINAL: No abdominal pain, nausea, vomiting or diarrhea, melena or bright red blood per rectum. GENITOURINARY: No urinary frequency, urgency, hesitancy or dysuria. MUSCULOSKELETAL: No joint or muscle pain, no back pain, no recent trauma. DERMATOLOGIC: No rash, no itching, no lesions. ENDOCRINE: No polyuria, polydipsia, no heat or cold intolerance. No recent change in weight. HEMATOLOGICAL: No anemia or easy bruising or bleeding. NEUROLOGIC: No headache, seizures, numbness, tingling or weakness. PSYCHIATRIC: No depression, no loss of interest in normal activity or change in sleep pattern.     Blood pressure 110/67, pulse 85, temperature 98.3 F (36.8 C), temperature source Oral, resp. rate 16, height 5\' 7"  (1.702 m), weight 70.8 kg,  SpO2 100 %.  Physical Exam:  See office notes   Results for orders placed or performed during the hospital encounter of 10/30/18 (from the past 24 hour(s))  Pregnancy, urine POC     Status: None   Collection Time: 10/30/18  5:46 AM  Result Value Ref Range   Preg Test, Ur NEGATIVE NEGATIVE    10/08/2018: T/V images.  Uterus and fibroid slightly smaller than on previous scan.  Uterus measured at 11.77 x 10.27 x 7.96  cm.  Largest intramural fibroid measured at 4.4 x 3.6 cm.  Submucosal fibroid measured at 3.2 x 2.6 cm.  The Endometrial line is distorted by the submucosal fibroid and is measured at 5.1 mm.  Left ovary normal.  Right ovary with 3 avascular simple follicles measured at 2.8 x 2.1 cm, 2.0 x 1.6 cm, 1.6 x 1.5 cm.  No free fluid in the posterior cul-de-sac.  Hb 8.8 on 10/27/2018   Assessment/Plan:  43 y.o. G4P4   1. Submucous myoma of uterus Menometrorrhagia with a SM Myoma stable in size x last year, measured at 3.2 cm.  Patient is now ready to proceed with a [place] Excision of SM Myoma with XL Myosure, D+C.  Surgical procedure, risks and benefits, preop/postop precautions and care thoroughly reviewed with patient.  Patient voiced understanding and agreement.  2. Menometrorrhagia As above.  [name] 10/30/2018, 7:28 AM

## 2018-10-30 NOTE — Discharge Instructions (Signed)
Post Anesthesia Home Care Instructions  Activity: Get plenty of rest for the remainder of the day. A responsible individual must stay with you for 24 hours following the procedure.  For the next 24 hours, DO NOT: -Drive a car -Paediatric nurse -Drink alcoholic beverages -Take any medication unless instructed by your physician -Make any legal decisions or sign important papers.  Meals: Start with liquid foods such as gelatin or soup. Progress to regular foods as tolerated. Avoid greasy, spicy, heavy foods. If nausea and/or vomiting occur, drink only clear liquids until the nausea and/or vomiting subsides. Call your physician if vomiting continues.  Special Instructions/Symptoms: Your throat may feel dry or sore from the anesthesia or the breathing tube placed in your throat during surgery. If this causes discomfort, gargle with warm salt water. The discomfort should disappear within 24 hours.  If you had a scopolamine patch placed behind your ear for the management of post- operative nausea and/or vomiting:  1. The medication in the patch is effective for 72 hours, after which it should be removed.  Wrap patch in a tissue and discard in the trash. Wash hands thoroughly with soap and water. 2. You may remove the patch earlier than 72 hours if you experience unpleasant side effects which may include dry mouth, dizziness or visual disturbances. 3. Avoid touching the patch. Wash your hands with soap and water after contact with the patch.    Hysteroscopy, Care After This sheet gives you information about how to care for yourself after your procedure. Your health care provider may also give you more specific instructions. If you have problems or questions, contact your health care provider. What can I expect after the procedure? After the procedure, it is common to have:  Cramping.  Bleeding. This can vary from light spotting to menstrual-like bleeding. Follow these instructions at  home: Activity  Rest for 1-2 days after the procedure.  Do not douche, use tampons, or have sex for 2 weeks after the procedure, or until your health care provider approves.  Do not drive for 24 hours after the procedure, or for as long as told by your health care provider.  Do not drive, use heavy machinery, or drink alcohol while taking prescription pain medicines. Medicines   Take over-the-counter and prescription medicines only as told by your health care provider.  Do not take aspirin during recovery. It can increase the risk of bleeding. General instructions  Do not take baths, swim, or use a hot tub until your health care provider approves. Take showers instead of baths for 2 weeks, or for as long as told by your health care provider.  To prevent or treat constipation while you are taking prescription pain medicine, your health care provider may recommend that you: ? Drink enough fluid to keep your urine clear or pale yellow. ? Take over-the-counter or prescription medicines. ? Eat foods that are high in fiber, such as fresh fruits and vegetables, whole grains, and beans. ? Limit foods that are high in fat and processed sugars, such as fried and sweet foods.  Keep all follow-up visits as told by your health care provider. This is important. Contact a health care provider if:  You feel dizzy or lightheaded.  You feel nauseous.  You have abnormal vaginal discharge.  You have a rash.  You have pain that does not get better with medicine.  You have chills. Get help right away if:  You have bleeding that is heavier than a normal menstrual  period.  You have a fever.  You have pain or cramps that get worse.  You develop new abdominal pain.  You faint.  You have pain in your shoulders.  You have shortness of breath. Summary  After the procedure, you may have cramping and some vaginal bleeding.  Do not douche, use tampons, or have sex for 2 weeks after the  procedure, or until your health care provider approves.  Do not take baths, swim, or use a hot tub until your health care provider approves. Take showers instead of baths for 2 weeks, or for as long as told by your health care provider.  Report any unusual symptoms to your health care provider.  Keep all follow-up visits as told by your health care provider. This is important. This information is not intended to replace advice given to you by your health care provider. Make sure you discuss any questions you have with your health care provider. Document Released: 02/17/2013 Document Revised: 05/28/2016 Document Reviewed: 05/28/2016 Elsevier Interactive Patient Education  2019 [employer].

## 2018-10-30 NOTE — Anesthesia Preprocedure Evaluation (Addendum)
Anesthesia Evaluation  Patient identified by MRN, date of birth, ID band Patient awake    Reviewed: Allergy & Precautions, Patient's Chart, lab work & pertinent test results  History of Anesthesia Complications Negative for: history of anesthetic complications  Airway Mallampati: II  TM Distance: >3 FB Neck ROM: Full    Dental  (+) Teeth Intact, Dental Advisory Given   Pulmonary neg pulmonary ROS,    Pulmonary exam normal        Cardiovascular negative cardio ROS Normal cardiovascular exam Rhythm:Regular Rate:Normal     Neuro/Psych negative neurological ROS  negative psych ROS   GI/Hepatic negative GI ROS, Neg liver ROS,   Endo/Other  negative endocrine ROS  Renal/GU Solitary kidney (Cr 1.11)  negative genitourinary   Musculoskeletal negative musculoskeletal ROS (+)   Abdominal   Peds  Hematology  (+) anemia ,   Anesthesia Other Findings Cr 1.11, K 4.0, Hgb 8.8  Reproductive/Obstetrics                          Anesthesia Physical Anesthesia Plan  ASA: II  Anesthesia Plan: General   Post-op Pain Management:    Induction: Intravenous  PONV Risk Score and Plan: 3 and Ondansetron, Dexamethasone, Midazolam and Treatment may vary due to age or medical condition  Airway Management Planned: LMA  Additional Equipment: None  Intra-op Plan:   Post-operative Plan: Extubation in OR  Informed Consent: I have reviewed the patients History and Physical, chart, labs and discussed the procedure including the risks, benefits and alternatives for the proposed anesthesia with the patient or authorized representative who has indicated his/her understanding and acceptance.     Dental advisory given  Plan Discussed with:   Anesthesia Plan Comments:        Anesthesia Quick Evaluation

## 2018-10-30 NOTE — Transfer of Care (Signed)
Immediate Anesthesia Transfer of Care Note  Patient: Rita Dean  Procedure(s) Performed: DILATATION & CURETTAGE/HYSTEROSCOPY WITH MYOSURE (N/A Vagina )  Patient Location: PACU  Anesthesia Type:General  Level of Consciousness: awake, alert , oriented and patient cooperative  Airway & Oxygen Therapy: Patient Spontanous Breathing and Patient connected to nasal cannula oxygen  Post-op Assessment: Report given to RN and Post -op Vital signs reviewed and stable  Post vital signs: Reviewed and stable  Last Vitals:  Vitals Value Taken Time  BP    Temp    Pulse    Resp    SpO2      Last Pain:  Vitals:   10/30/18 0606  TempSrc: Oral  PainSc: 0-No pain      Patients Stated Pain Goal: 6 (56/43/32 [id number])  Complications: No apparent anesthesia complications

## 2018-10-30 NOTE — Anesthesia Postprocedure Evaluation (Signed)
Anesthesia Post Note  Patient: Rita Dean  Procedure(s) Performed: DILATATION & CURETTAGE/HYSTEROSCOPY WITH MYOSURE (N/A Vagina )     Patient location during evaluation: PACU Anesthesia Type: General Level of consciousness: awake and alert Pain management: pain level controlled Vital Signs Assessment: post-procedure vital signs reviewed and stable Respiratory status: spontaneous breathing, nonlabored ventilation and respiratory function stable Cardiovascular status: blood pressure returned to baseline and stable Postop Assessment: no apparent nausea or vomiting Anesthetic complications: no    Last Vitals:  Vitals:   10/30/18 0900 10/30/18 0915  BP: 101/67 105/66  Pulse: (!) 59 (!) 59  Resp: (!) 8 (!) 21  Temp:    SpO2: 97% 100%    Last Pain:  Vitals:   10/30/18 0915  TempSrc:   PainSc: 0-No pain                 Lidia Collum

## 2018-10-30 NOTE — Op Note (Signed)
Operative Note  10/30/2018  8:21 AM  PATIENT:  Rita Dean  43 y.o. female  PRE-OPERATIVE DIAGNOSIS:  Submucosal Myomas, Menorrhagia  POST-OPERATIVE DIAGNOSIS:  Submucosal Myomas, Menorrhagia  PROCEDURE:  Procedure(s): DILATATION & CURETTAGE/HYSTEROSCOPY WITH MYOSURE EXCISION  SURGEON:  Surgeon(s): Princess Bruins, MD  ANESTHESIA:   general  FINDINGS: 2 submucosal myomas: 1 at fundus measuring 1 cm and 1 at the right anterior/lateral wall measuring 3.0-3.5 cm.  DESCRIPTION OF OPERATION: Under general anesthesia with laryngeal mask, the patient is in lithotomy position.  She is prepped with Betadine on the suprapubic, vulvar and vaginal areas.  The bladder is catheterized.  The patient is draped as usual.  Timeout is done.  The vaginal exam reveals an anteverted uterus slightly increased in volume, mobile.  No adnexal mass.  The speculum is inserted in the vagina and the anterior lip of the cervix is grasped with a tenaculum.  A paracervical block is done with lidocaine 1% a total of 20 cc at 4 and 8:00.  Dilation of the cervix with Pratt dilators up to #31 without difficulty.  Insertion of the hysteroscope with the XL MyoSure in the intrauterine cavity.  Inspection of the intrauterine cavity reveals 2 normal ostia, a very small calcified fibroid at the fundus measuring 1 cm and a larger submucosal fibroid at the right anterior lateral wall measuring 3 to 3.5 cm.  The small calcified fundal fibroid is completely excised, we then excised the right anterior lateral fibroid.  Hemostasis is adequate at all levels.  Pictures were taken before excision and after.  The hysteroscope with the MyoSure instrument are removed.  We proceeded with a systematic curettage of the intrauterine cavity on all surfaces with a sharp curette.  All specimens are sent together to pathology.  All instruments are removed.  Hemostasis was adequate on the cervix.  The patient was brought to recovery room in good and  stable status.  ESTIMATED BLOOD LOSS: 5 mL FLUID DEFICIT 800 cc  Intake/Output Summary (Last 24 hours) at 10/30/2018 1638 Last data filed at 10/30/2018 0820 Gross per 24 hour  Intake 850 ml  Output 205 ml  Net 645 ml     BLOOD ADMINISTERED:none   LOCAL MEDICATIONS USED:  LIDOCAINE 1% 20 cc Paracervical Block   SPECIMEN:  Source of Specimen:  Resection of SM Myomas and Endometrial Curettings  DISPOSITION OF SPECIMEN:  PATHOLOGY  COUNTS:  YES  PLAN OF CARE: Transfer to PACU  Marie-Lyne LavoieMD8:21 AM

## 2018-10-30 NOTE — Anesthesia Procedure Notes (Signed)
Procedure Name: LMA Insertion Date/Time: 10/30/2018 7:40 AM Performed by: Wanita Chamberlain, CRNA Pre-anesthesia Checklist: Patient identified, Emergency Drugs available, Suction available, Patient being monitored and Timeout performed Patient Re-evaluated:Patient Re-evaluated prior to induction Oxygen Delivery Method: Circle system utilized Preoxygenation: Pre-oxygenation with 100% oxygen Induction Type: IV induction Ventilation: Mask ventilation without difficulty LMA: LMA inserted LMA Size: 4.0 Number of attempts: 1 Airway Equipment and Method: Bite block Placement Confirmation: CO2 detector,  positive ETCO2 and breath sounds checked- equal and bilateral Tube secured with: Tape Dental Injury: Teeth and Oropharynx as per pre-operative assessment

## 2018-11-02 ENCOUNTER — Other Ambulatory Visit: Payer: Self-pay

## 2018-11-02 ENCOUNTER — Encounter (HOSPITAL_BASED_OUTPATIENT_CLINIC_OR_DEPARTMENT_OTHER): Payer: Self-pay | Admitting: Obstetrics & Gynecology

## 2018-11-03 ENCOUNTER — Ambulatory Visit: Payer: BC Managed Care – PPO | Admitting: Obstetrics & Gynecology

## 2018-11-03 ENCOUNTER — Encounter: Payer: Self-pay | Admitting: Obstetrics & Gynecology

## 2018-11-03 VITALS — BP 118/80

## 2018-11-03 DIAGNOSIS — N72 Inflammatory disease of cervix uteri: Secondary | ICD-10-CM | POA: Diagnosis not present

## 2018-11-03 DIAGNOSIS — R87810 Cervical high risk human papillomavirus (HPV) DNA test positive: Secondary | ICD-10-CM | POA: Diagnosis not present

## 2018-11-03 DIAGNOSIS — N870 Mild cervical dysplasia: Secondary | ICD-10-CM

## 2018-11-03 DIAGNOSIS — R87610 Atypical squamous cells of undetermined significance on cytologic smear of cervix (ASC-US): Secondary | ICD-10-CM | POA: Diagnosis not present

## 2018-11-03 DIAGNOSIS — Z09 Encounter for follow-up examination after completed treatment for conditions other than malignant neoplasm: Secondary | ICD-10-CM

## 2018-11-03 NOTE — Progress Notes (Signed)
    Rita Dean 08/11/1975 [id number]        43 y.o.  G4P4L3 Married  RP: ASCUS/HPV HR negative/H/O HPV HR positive for Colposcopy and Postop HSC  HPI: ASCUS/HPV HR negative 09/23/2018.  H/O HPV HR positive 3 yrs ago.  Postop [facility] Myosure excision of SM Myomas/D+C on 10/30/2018.  No vaginal bleeding.  No pelvic pain.  No fever.   OB History  Gravida Para Term Preterm AB Living  4 4       3   SAB TAB Ectopic Multiple Live Births               # Outcome Date GA Lbr Len/2nd Weight Sex Delivery Anes PTL Lv  4 Para           3 Para           2 Para           1 Para             Past medical history,surgical history, problem list, medications, allergies, family history and social history were all reviewed and documented in the EPIC chart.   Directed ROS with pertinent positives and negatives documented in the history of present illness/assessment and plan.  Exam:  Vitals:   11/03/18 0904  BP: 118/80   General appearance:  Normal  Colposcopy Procedure Note Rita Dean 11/03/2018  Indications: ASCUS/HPV HR negative/H/O HPV HR positive  Procedure Details  The risks and benefits of the procedure and Verbal informed consent obtained.  Speculum placed in vagina and excellent visualization of cervix achieved, cervix swabbed x 3 with acetic acid solution.  Findings:  Cervix colposcopy: Physical Exam Genitourinary:       Vaginal colposcopy: Normal  Vulvar colposcopy: Normal  Perirectal colposcopy: Grossly normal  The cervix was sprayed with Hurricane before performing the cervical biopsies.  Specimens:  Cervical Bx at 3 and 6 O'Clock.  Complications:  None, good hemostasis with Silver Nitrate . Plan:  Per Cervical Bx results  Patho 10/31/18:  Diagnosis Endometrium, curettage, and fibroid - INACTIVE ENDOMETRIUM. LEIOMYOMA (BENIGN).   Assessment/Plan:  43 y.o. G4P4   1. ASCUS with positive high risk HPV cervical Significance of ASCUS with history of high  risk HPV discussed with patient.  Patient reassured that high risk HPV was negative may 2020.  Colposcopy procedure explained to patient.  Colposcopy findings reviewed.  Well-tolerated.  No complication.  Management per cervical biopsy results.  Pending cervical biopsies. - Surgical pathology( [place]/ [facility])  2. Status post gynecological surgery, follow-up exam Status post hysteroscopy with MyoSure excision of submucosal myoma and D&C on June 20.  Good postop evolution.  Pathology benign and reviewed with patient.  Counseling on above issues and coordination of care more than 50% for 15 minutes.  Princess Bruins MD, 9:11 AM 11/03/2018

## 2018-11-06 ENCOUNTER — Encounter: Payer: Self-pay | Admitting: Obstetrics & Gynecology

## 2018-11-06 NOTE — Patient Instructions (Signed)
1. ASCUS with positive high risk HPV cervical Significance of ASCUS with history of high risk HPV discussed with patient.  Patient reassured that high risk HPV was negative may 2020.  Colposcopy procedure explained to patient.  Colposcopy findings reviewed.  Well-tolerated.  No complication.  Management per cervical biopsy results.  Pending cervical biopsies. - Surgical pathology( [place]/ [facility])  2. Status post gynecological surgery, follow-up exam Status post hysteroscopy with MyoSure excision of submucosal myoma and D&C on June 20.  Good postop evolution.  Pathology benign and reviewed with patient.  Jaely, it was a pleasure seeing you today!  I will inform you of your results as soon as they are available.

## 2018-12-03 ENCOUNTER — Encounter (HOSPITAL_BASED_OUTPATIENT_CLINIC_OR_DEPARTMENT_OTHER): Payer: Self-pay | Admitting: Obstetrics & Gynecology

## 2019-04-28 DIAGNOSIS — J3081 Allergic rhinitis due to animal (cat) (dog) hair and dander: Secondary | ICD-10-CM | POA: Diagnosis not present

## 2019-04-28 DIAGNOSIS — J301 Allergic rhinitis due to pollen: Secondary | ICD-10-CM | POA: Diagnosis not present

## 2019-04-28 DIAGNOSIS — J3089 Other allergic rhinitis: Secondary | ICD-10-CM | POA: Diagnosis not present

## 2019-04-28 DIAGNOSIS — L2089 Other atopic dermatitis: Secondary | ICD-10-CM | POA: Diagnosis not present

## 2019-05-24 DIAGNOSIS — S336XXA Sprain of sacroiliac joint, initial encounter: Secondary | ICD-10-CM | POA: Diagnosis not present

## 2019-09-30 ENCOUNTER — Other Ambulatory Visit: Payer: Self-pay | Admitting: Obstetrics & Gynecology

## 2019-09-30 ENCOUNTER — Encounter: Payer: Self-pay | Admitting: Obstetrics & Gynecology

## 2019-09-30 ENCOUNTER — Ambulatory Visit (INDEPENDENT_AMBULATORY_CARE_PROVIDER_SITE_OTHER): Payer: BC Managed Care – PPO | Admitting: Obstetrics & Gynecology

## 2019-09-30 ENCOUNTER — Other Ambulatory Visit: Payer: Self-pay

## 2019-09-30 VITALS — BP 112/70 | Ht 66.0 in | Wt 146.0 lb

## 2019-09-30 DIAGNOSIS — D219 Benign neoplasm of connective and other soft tissue, unspecified: Secondary | ICD-10-CM | POA: Diagnosis not present

## 2019-09-30 DIAGNOSIS — Z1231 Encounter for screening mammogram for malignant neoplasm of breast: Secondary | ICD-10-CM

## 2019-09-30 DIAGNOSIS — Z01419 Encounter for gynecological examination (general) (routine) without abnormal findings: Secondary | ICD-10-CM

## 2019-09-30 DIAGNOSIS — N870 Mild cervical dysplasia: Secondary | ICD-10-CM | POA: Diagnosis not present

## 2019-09-30 NOTE — Progress Notes (Signed)
JOHNINE Dean 1976/04/14 [id number]   History:    44 y.o.  G4P4L3 Married.  Patient's mother passed away 2 years ago.  RP:  Established patient presenting for annual gyn exam   HPI: [facility] Myosure excision of SM Myoma/D+C in 10/2018.  Menses monthly with variable flow, normal to heavy.  Rare BTB.  No pelvic pain.  Currently abstinent.  Urine and bowel movements normal.  Breast normal.  Body mass index 23.57.  Patient is physically active on a regular basis.  Healthy nutrition.  Kidney donor.  Will schedule Fasting Health Labs with Fam MD Dr Rita Dean.   Past medical history,surgical history, family history and social history were all reviewed and documented in the EPIC chart.  Gynecologic History Patient's last menstrual period was 09/07/2019.  Obstetric History OB History  Gravida Para Term Preterm AB Living  4 4       3   SAB TAB Ectopic Multiple Live Births               # Outcome Date GA Lbr Len/2nd Weight Sex Delivery Anes PTL Lv  4 Para           3 Para           2 Para           1 Para              ROS: A ROS was performed and pertinent positives and negatives are included in the history.  GENERAL: No fevers or chills. HEENT: No change in vision, no earache, sore throat or sinus congestion. NECK: No pain or stiffness. CARDIOVASCULAR: No chest pain or pressure. No palpitations. PULMONARY: No shortness of breath, cough or wheeze. GASTROINTESTINAL: No abdominal pain, nausea, vomiting or diarrhea, melena or bright red blood per rectum. GENITOURINARY: No urinary frequency, urgency, hesitancy or dysuria. MUSCULOSKELETAL: No joint or muscle pain, no back pain, no recent trauma. DERMATOLOGIC: No rash, no itching, no lesions. ENDOCRINE: No polyuria, polydipsia, no heat or cold intolerance. No recent change in weight. HEMATOLOGICAL: No anemia or easy bruising or bleeding. NEUROLOGIC: No headache, seizures, numbness, tingling or weakness. PSYCHIATRIC: No depression, no loss of interest in  normal activity or change in sleep pattern.     Exam:   BP 112/70    Ht 5\' 6"  (1.676 m)    Wt 146 lb (66.2 kg)    LMP 09/07/2019    BMI 23.57 kg/m   Body mass index is 23.57 kg/m.  General appearance : Well developed well nourished female. No acute distress HEENT: Eyes: no retinal hemorrhage or exudates,  Neck supple, trachea midline, no carotid bruits, no thyroidmegaly Lungs: Clear to auscultation, no rhonchi or wheezes, or rib retractions  Heart: Regular rate and rhythm, no murmurs or gallops Breast:Examined in sitting and supine position were symmetrical in appearance, no palpable masses or tenderness,  no skin retraction, no nipple inversion, no nipple discharge, no skin discoloration, no axillary or supraclavicular lymphadenopathy Abdomen: no palpable masses or tenderness, no rebound or guarding Extremities: no edema or skin discoloration or tenderness  Pelvic: Vulva: Normal             Vagina: No gross lesions or discharge  Cervix: No gross lesions or discharge.  Pap reflex done.  Uterus  AV, mildly increased size 9 cm, nodular, non-tender and mobile  Adnexa  Without masses or tenderness  Anus: Normal   Assessment/Plan:  44 y.o. female for annual exam   1. Encounter for  routine gynecological examination with Papanicolaou smear of cervix Gynecologic exam with stable fibroid uterus about 9 cm and nodular.  Pap reflex done.  Breast exam normal.  Needs to schedule a screening mammogram.  Will do fasting health labs with Dr. Gale Dean family physician.  2. Dysplasia of cervix, low grade (CIN 1) Colposcopy June 2020 showed CIN-1.    3. Fibroids Stable fibroids which are currently asymptomatic, patient had a hysteroscopy with MyoSure excision of a submucosal myoma in June 2020.  Rita Bruins MD, 11:56 AM 09/30/2019

## 2019-10-01 ENCOUNTER — Encounter: Payer: Self-pay | Admitting: Obstetrics & Gynecology

## 2019-10-01 NOTE — Patient Instructions (Signed)
1. Encounter for routine gynecological examination with Papanicolaou smear of cervix Gynecologic exam with stable fibroid uterus about 9 cm and nodular.  Pap reflex done.  Breast exam normal.  Needs to schedule a screening mammogram.  Will do fasting health labs with Dr. Gale Journey family physician.  2. Dysplasia of cervix, low grade (CIN 1) Colposcopy June 2020 showed CIN-1.    3. Fibroids Stable fibroids which are currently asymptomatic, patient had a hysteroscopy with MyoSure excision of a submucosal myoma in June 2020.  Rita Dean, it was a pleasure seeing you today!  I will inform you of your results as soon as they are available.

## 2019-10-04 LAB — PAP IG W/ RFLX HPV ASCU

## 2019-10-07 ENCOUNTER — Ambulatory Visit
Admission: RE | Admit: 2019-10-07 | Discharge: 2019-10-07 | Disposition: A | Discharge status: Home / Self Care | Payer: BC Managed Care – PPO | Source: Ambulatory Visit | Attending: Obstetrics & Gynecology | Admitting: Obstetrics & Gynecology

## 2019-10-07 ENCOUNTER — Other Ambulatory Visit: Payer: Self-pay

## 2019-10-07 DIAGNOSIS — Z1231 Encounter for screening mammogram for malignant neoplasm of breast: Secondary | ICD-10-CM

## 2019-10-18 DIAGNOSIS — Z13228 Encounter for screening for other metabolic disorders: Secondary | ICD-10-CM | POA: Diagnosis not present

## 2019-10-18 DIAGNOSIS — Z1389 Encounter for screening for other disorder: Secondary | ICD-10-CM | POA: Diagnosis not present

## 2019-10-18 DIAGNOSIS — E538 Deficiency of other specified B group vitamins: Secondary | ICD-10-CM | POA: Diagnosis not present

## 2019-10-18 DIAGNOSIS — Z905 Acquired absence of kidney: Secondary | ICD-10-CM | POA: Diagnosis not present

## 2019-10-18 DIAGNOSIS — Z130 Encounter for screening for diseases of the blood and blood-forming organs and certain disorders involving the immune mechanism: Secondary | ICD-10-CM | POA: Diagnosis not present

## 2019-10-18 DIAGNOSIS — Z0000 Encounter for general adult medical examination without abnormal findings: Secondary | ICD-10-CM | POA: Diagnosis not present

## 2019-10-18 DIAGNOSIS — D500 Iron deficiency anemia secondary to blood loss (chronic): Secondary | ICD-10-CM | POA: Diagnosis not present

## 2019-10-18 DIAGNOSIS — Z1329 Encounter for screening for other suspected endocrine disorder: Secondary | ICD-10-CM | POA: Diagnosis not present

## 2019-10-18 DIAGNOSIS — Z13220 Encounter for screening for lipoid disorders: Secondary | ICD-10-CM | POA: Diagnosis not present

## 2020-09-25 ENCOUNTER — Other Ambulatory Visit: Payer: Self-pay

## 2020-09-25 ENCOUNTER — Other Ambulatory Visit: Payer: Self-pay | Admitting: Physician Assistant

## 2020-09-25 DIAGNOSIS — Z1231 Encounter for screening mammogram for malignant neoplasm of breast: Secondary | ICD-10-CM

## 2020-11-22 ENCOUNTER — Ambulatory Visit: Payer: BC Managed Care – PPO

## 2020-12-22 ENCOUNTER — Encounter: Payer: Self-pay | Admitting: Hematology and Oncology

## 2021-01-10 ENCOUNTER — Encounter: Payer: Self-pay | Admitting: Hematology and Oncology

## 2021-01-10 ENCOUNTER — Ambulatory Visit
Admission: RE | Admit: 2021-01-10 | Discharge: 2021-01-10 | Disposition: A | Discharge status: Home / Self Care | Payer: Commercial Managed Care - PPO | Source: Ambulatory Visit | Attending: Physician Assistant | Admitting: Physician Assistant

## 2021-01-10 ENCOUNTER — Other Ambulatory Visit: Payer: Self-pay

## 2021-01-10 DIAGNOSIS — Z1231 Encounter for screening mammogram for malignant neoplasm of breast: Secondary | ICD-10-CM

## 2021-01-16 ENCOUNTER — Encounter: Payer: Self-pay | Admitting: Hematology and Oncology

## 2021-01-17 ENCOUNTER — Other Ambulatory Visit: Payer: Self-pay | Admitting: Physician Assistant

## 2021-01-17 DIAGNOSIS — R928 Other abnormal and inconclusive findings on diagnostic imaging of breast: Secondary | ICD-10-CM

## 2021-02-01 ENCOUNTER — Other Ambulatory Visit: Payer: Commercial Managed Care - PPO

## 2021-02-02 ENCOUNTER — Other Ambulatory Visit: Payer: Commercial Managed Care - PPO

## 2021-03-05 ENCOUNTER — Ambulatory Visit
Admission: RE | Admit: 2021-03-05 | Discharge: 2021-03-05 | Disposition: A | Discharge status: Home / Self Care | Payer: Commercial Managed Care - PPO | Source: Ambulatory Visit | Attending: Physician Assistant | Admitting: Physician Assistant

## 2021-03-05 ENCOUNTER — Other Ambulatory Visit: Payer: Self-pay

## 2021-03-05 DIAGNOSIS — R928 Other abnormal and inconclusive findings on diagnostic imaging of breast: Secondary | ICD-10-CM

## 2021-07-26 ENCOUNTER — Other Ambulatory Visit: Payer: Self-pay

## 2021-07-26 ENCOUNTER — Encounter: Payer: Self-pay | Admitting: Obstetrics & Gynecology

## 2021-07-26 ENCOUNTER — Other Ambulatory Visit (HOSPITAL_COMMUNITY)
Admission: RE | Admit: 2021-07-26 | Discharge: 2021-07-26 | Disposition: A | Discharge status: Home / Self Care | Payer: Commercial Managed Care - PPO | Source: Ambulatory Visit | Attending: Obstetrics & Gynecology | Admitting: Obstetrics & Gynecology

## 2021-07-26 ENCOUNTER — Encounter: Payer: Self-pay | Admitting: Hematology and Oncology

## 2021-07-26 ENCOUNTER — Ambulatory Visit (INDEPENDENT_AMBULATORY_CARE_PROVIDER_SITE_OTHER): Payer: Commercial Managed Care - PPO | Admitting: Obstetrics & Gynecology

## 2021-07-26 VITALS — BP 110/64 | HR 88 | Resp 16 | Ht 66.25 in | Wt 173.0 lb

## 2021-07-26 DIAGNOSIS — Z113 Encounter for screening for infections with a predominantly sexual mode of transmission: Secondary | ICD-10-CM

## 2021-07-26 DIAGNOSIS — N921 Excessive and frequent menstruation with irregular cycle: Secondary | ICD-10-CM | POA: Diagnosis not present

## 2021-07-26 DIAGNOSIS — N926 Irregular menstruation, unspecified: Secondary | ICD-10-CM

## 2021-07-26 DIAGNOSIS — N870 Mild cervical dysplasia: Secondary | ICD-10-CM | POA: Diagnosis not present

## 2021-07-26 DIAGNOSIS — Z01419 Encounter for gynecological examination (general) (routine) without abnormal findings: Secondary | ICD-10-CM | POA: Insufficient documentation

## 2021-07-26 LAB — PREGNANCY, URINE: Preg Test, Ur: NEGATIVE

## 2021-07-26 MED ORDER — NORETHINDRONE 0.35 MG PO TABS: 1.0000 | ORAL_TABLET | Freq: Every day | ORAL | 4 refills | Status: AC

## 2021-07-26 NOTE — Progress Notes (Signed)
? ? ?Rita Dean 06-06-1975 [id number] ? ? ?History:    46 y.o.   G12P4L3 Married.  Patient's mother passed away 3 years ago. ?  ?RP:  Established patient presenting for annual gyn exam  ?  ?HPI: [facility] Myosure excision of SM Myoma/D+C in 10/2018.  Menses every 1-2 months, now bleeding everyday x 1 month.  No pelvic pain.  Sexually active, using condoms.  Pap Neg 09/2019.  H/O CIN 1.  Pap/HPV HR/Gono-Chlam today.  Urine and bowel movements normal.  Breast normal.  Mammo 12/2020, Rt Dx mammo/US Negative 02/2021.  Body mass index 27.71.  Patient is physically active on a regular basis.  Healthy nutrition.  Kidney donor. Fasting Health Labs with Fam MD Dr Gale Journey.  Colono normal 05/2021. ? ? ?Past medical history,surgical history, family history and social history were all reviewed and documented in the EPIC chart. ? ?Gynecologic History ?Patient's last menstrual period was 06/16/2021 (exact date). ?Obstetric History ?OB History  ?Gravida Para Term Preterm AB Living  ?'4 4       3  '$ ?SAB IAB Ectopic Multiple Live Births  ?        4  ?  ?# Outcome Date GA Lbr Len/2nd Weight Sex Delivery Anes PTL Lv  ?4 Para           ?3 Para           ?2 Para           ?1 Para           ? ? ? ?ROS: A ROS was performed and pertinent positives and negatives are included in the history. ? GENERAL: No fevers or chills. HEENT: No change in vision, no earache, sore throat or sinus congestion. NECK: No pain or stiffness. CARDIOVASCULAR: No chest pain or pressure. No palpitations. PULMONARY: No shortness of breath, cough or wheeze. GASTROINTESTINAL: No abdominal pain, nausea, vomiting or diarrhea, melena or bright red blood per rectum. GENITOURINARY: No urinary frequency, urgency, hesitancy or dysuria. MUSCULOSKELETAL: No joint or muscle pain, no back pain, no recent trauma. DERMATOLOGIC: No rash, no itching, no lesions. ENDOCRINE: No polyuria, polydipsia, no heat or cold intolerance. No recent change in weight. HEMATOLOGICAL: No anemia or easy bruising  or bleeding. NEUROLOGIC: No headache, seizures, numbness, tingling or weakness. PSYCHIATRIC: No depression, no loss of interest in normal activity or change in sleep pattern.  ?  ? ?Exam: ? ? ?BP 110/64   Pulse 88   Resp 16   Ht 5' 6.25" (1.683 m)   Wt 173 lb (78.5 kg)   LMP 06/16/2021 (Exact Date) Comment: started bleeding 06-16-21 & still bleeding today  BMI 27.71 kg/m?  ? ?Body mass index is 27.71 kg/m?. ? ?General appearance : Well developed well nourished female. No acute distress ?HEENT: Eyes: no retinal hemorrhage or exudates,  Neck supple, trachea midline, no carotid bruits, no thyroidmegaly ?Lungs: Clear to auscultation, no rhonchi or wheezes, or rib retractions  ?Heart: Regular rate and rhythm, no murmurs or gallops ?Breast:Examined in sitting and supine position were symmetrical in appearance, no palpable masses or tenderness,  no skin retraction, no nipple inversion, no nipple discharge, no skin discoloration, no axillary or supraclavicular lymphadenopathy ?Abdomen: no palpable masses or tenderness, no rebound or guarding ?Extremities: no edema or skin discoloration or tenderness ? ?Pelvic: Vulva: Normal ?            Vagina: No gross lesions or discharge ? Cervix: No gross lesions or discharge.  Moderate dark menstrual  blood.  Pap/HPV HR/Gono-Chlam done. ? Uterus  AV, normal size, shape and consistency, non-tender and mobile ? Adnexa  Without masses or tenderness ? Anus: Normal ? ?UPT Neg ? ? ?Assessment/Plan:  46 y.o. female for annual exam  ? ?1. Encounter for routine gynecological examination with Papanicolaou smear of cervix ?[facility] Myosure excision of SM Myoma/D+C in 10/2018.  Menses every 1-2 months, now bleeding everyday x 1 month.  No pelvic pain.  Sexually active, using condoms.  Pap Neg 09/2019.  H/O CIN1.  Pap/HPV HR/Gono-Chlam today.  Urine and bowel movements normal.  Breast normal.  Mammo 12/2020, Rt Dx mammo/US Negative 02/2021.  Body mass index 27.71.  Patient is physically active on a  regular basis.  Healthy nutrition.  Kidney donor. Fasting Health Labs with Fam MD Dr Gale Journey.  Colono normal 05/2021. ?- Cytology - PAP( [place]) ? ?2. Dysplasia of cervix, low grade (CIN 1) ?- Cytology - PAP( [facility]) with HR HPV ? ?3. Screen for STD (sexually transmitted disease) ?- Cytology - PAP( [place])-HPV HR, Gono-Chlam  ? ?4. Menometrorrhagia ?[place] Myosure excision of SM Myoma/D+C in 10/2018.  Menses every 1-2 months, now bleeding everyday x 1 month.  No pelvic pain.  Gynecologic exam normal today.  Will further investigate with a CBC, TSH, Prolactin today and a Pelvic US at f/u.  Start on the Progestin only pill to control the cycle.  No CI. Usage reviewed and prescription sent to pharmacy. ?- CBC ?- TSH ?- Prolactin ?- US Transvaginal Non-OB; Future ? ?5. Irregular bleeding ?UPT Negative ?- Pregnancy, urine ? ?Other orders ?- ferrous sulfate 325 (65 FE) MG tablet; Take by mouth. ?- Multiple Vitamin (MULTIVITAMIN PO); Take by mouth. ?- norethindrone (MICRONOR) 0.35 MG tablet; Take 1 tablet (0.35 mg total) by mouth daily.  ? ?Princess Bruins MD, 9:19 AM 07/26/2021 ? ?  ?

## 2021-07-27 LAB — TSH: TSH: 1.18 mIU/L

## 2021-07-27 LAB — CBC
HCT: 40.9 % (ref 35.0–45.0)
Hemoglobin: 13.2 g/dL (ref 11.7–15.5)
MCH: 27.3 pg (ref 27.0–33.0)
MCHC: 32.3 g/dL (ref 32.0–36.0)
MCV: 84.7 fL (ref 80.0–100.0)
MPV: 11.9 fL (ref 7.5–12.5)
Platelets: 259 10*3/uL (ref 140–400)
RBC: 4.83 10*6/uL (ref 3.80–5.10)
RDW: 12.6 % (ref 11.0–15.0)
WBC: 4.1 10*3/uL (ref 3.8–10.8)

## 2021-07-27 LAB — PROLACTIN: Prolactin: 8.7 ng/mL

## 2021-07-30 LAB — CYTOLOGY - PAP
Chlamydia: NEGATIVE
Comment: NEGATIVE
Comment: NEGATIVE
Comment: NORMAL
Diagnosis: NEGATIVE
High risk HPV: NEGATIVE
Neisseria Gonorrhea: NEGATIVE

## 2021-08-02 ENCOUNTER — Encounter: Payer: Self-pay | Admitting: Obstetrics & Gynecology

## 2021-08-28 ENCOUNTER — Ambulatory Visit (INDEPENDENT_AMBULATORY_CARE_PROVIDER_SITE_OTHER): Payer: Commercial Managed Care - PPO | Admitting: Obstetrics & Gynecology

## 2021-08-28 ENCOUNTER — Encounter: Payer: Self-pay | Admitting: Obstetrics & Gynecology

## 2021-08-28 ENCOUNTER — Ambulatory Visit (INDEPENDENT_AMBULATORY_CARE_PROVIDER_SITE_OTHER): Payer: Commercial Managed Care - PPO

## 2021-08-28 VITALS — BP 118/76

## 2021-08-28 DIAGNOSIS — N921 Excessive and frequent menstruation with irregular cycle: Secondary | ICD-10-CM

## 2021-08-28 DIAGNOSIS — D219 Benign neoplasm of connective and other soft tissue, unspecified: Secondary | ICD-10-CM

## 2021-08-28 NOTE — Progress Notes (Signed)
? ? ?  Rita Dean 04-06-1976 [id number] ? ? ?     46 y.o.  G4P4L3 ? ?RP: Menometrorrhagia/Fibroids for Pelvic US ? ?HPI: Menometrorrhagia improved on the Progestin pill, only spotting now.  Had a HSC/Myosure excision of Fibroids/D+C.  No pelvic pain.  Urine/BMs normal. ? ? ?OB History  ?Gravida Para Term Preterm AB Living  ?'4 4       3  '$ ?SAB IAB Ectopic Multiple Live Births  ?        4  ?  ?# Outcome Date GA Lbr Len/2nd Weight Sex Delivery Anes PTL Lv  ?4 Para           ?3 Para           ?2 Para           ?1 Para           ? ? ?Past medical history,surgical history, problem list, medications, allergies, family history and social history were all reviewed and documented in the EPIC chart. ? ? ?Directed ROS with pertinent positives and negatives documented in the history of present illness/assessment and plan. ? ?Exam: ? ?Vitals:  ? 08/28/21 1510  ?BP: 118/76  ? ?General appearance:  Normal ? ?Pelvic US today: T/V images.  Anteverted uterus enlarged with multiple intramural and subserosal fibroids.  The overall uterine size is measured at 9.58 x 7.35 x 6.72 cm.  The largest fibroid are measured at 2.53 cm and 2.51 cm. ?Fibroids were measured.  2 fibroids appear submucosal 1 at 1.8 cm and the other 1 at 1.3 cm.  The endometrial lining is measured at 12.16 mm.  Unable to further evaluate the endometrial cavity and walls due to central fibroids.  Both ovaries are normal in size with sparse follicles.  No adnexal mass.  No free fluid in the pelvis. ? ? ?Assessment/Plan:  46 y.o. G4P4  ? ?1. Menometrorrhagia ?Menometrorrhagia with many uterine fibroids including 2 submucosal fibroids.  Pelvic US findings thoroughly reviewed with patient. ? ?2. Fibroids (SM, IM, SS)  ?F/U Preop visit.  Options discussed.  Will think about it and decide between a HSC/Myosure Excision of SM Fibroids/D+C/Novasure Endometrial Ablation or XI Robotic TLH. ? ?Princess Bruins MD, 3:44 PM 08/28/2021 ? ? ? ?  ?

## 2021-09-11 ENCOUNTER — Encounter: Payer: Self-pay | Admitting: Obstetrics & Gynecology

## 2022-03-13 ENCOUNTER — Other Ambulatory Visit: Payer: Self-pay | Admitting: Physician Assistant

## 2022-03-13 DIAGNOSIS — Z1231 Encounter for screening mammogram for malignant neoplasm of breast: Secondary | ICD-10-CM

## 2022-05-09 ENCOUNTER — Ambulatory Visit
Admission: RE | Admit: 2022-05-09 | Discharge: 2022-05-09 | Disposition: A | Discharge status: Home / Self Care | Payer: Commercial Managed Care - PPO | Source: Ambulatory Visit | Attending: Physician Assistant | Admitting: Physician Assistant

## 2022-05-09 DIAGNOSIS — Z1231 Encounter for screening mammogram for malignant neoplasm of breast: Secondary | ICD-10-CM

## 2022-09-09 IMAGING — MG MM DIGITAL DIAGNOSTIC UNILAT*R* W/ TOMO W/ CAD
4 series · 4 of 12 positions shown · non-contrast
Comparison: Previous exam(s).

CLINICAL DATA: Patient returns after screening study for evaluation
possible RIGHT breast asymmetry.

EXAM:
DIGITAL DIAGNOSTIC UNILATERAL RIGHT MAMMOGRAM WITH TOMOSYNTHESIS AND
CAD; ULTRASOUND RIGHT BREAST LIMITED
TECHNIQUE: Right digital diagnostic mammography and breast tomosynthesis was
performed. The images were evaluated with computer-aided detection.;
Targeted ultrasound examination of the right breast was performed

[R MLO synth-2D]
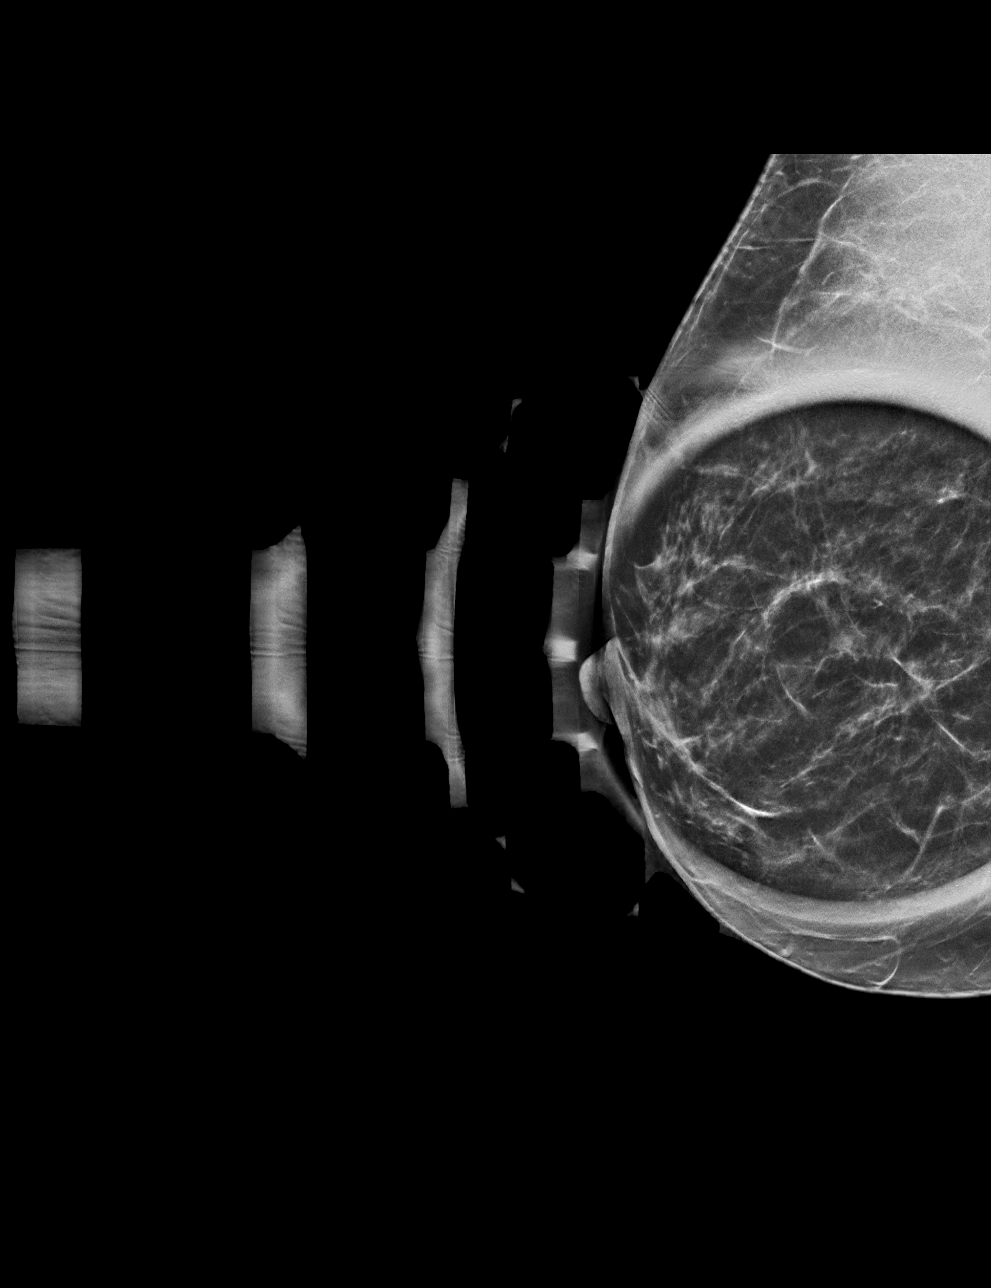

[R ML synth-2D]
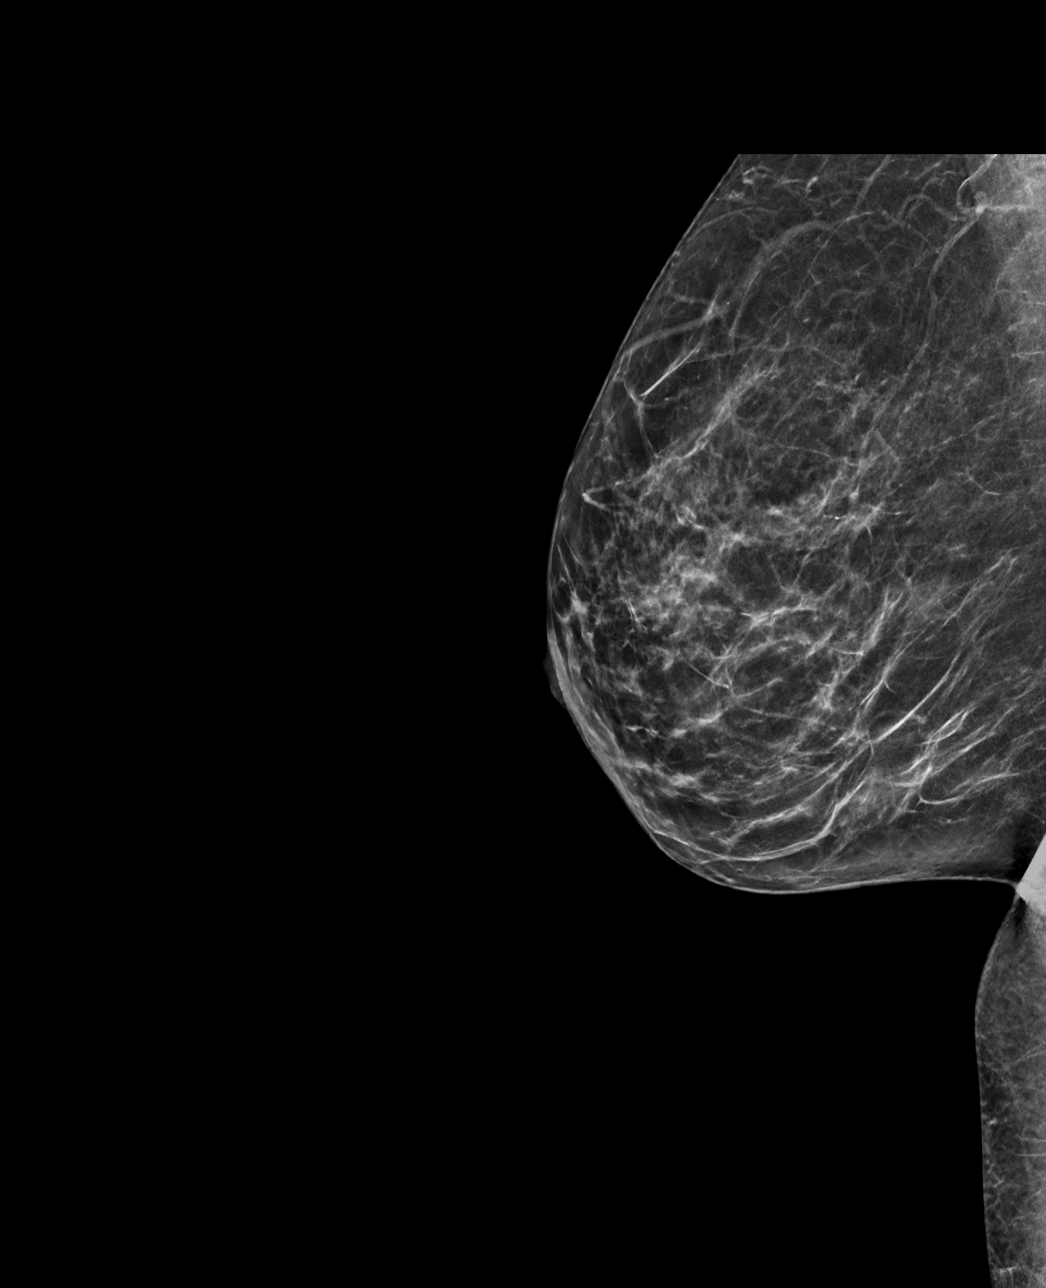

[R ML tomo · tomo slice 31/62.0]
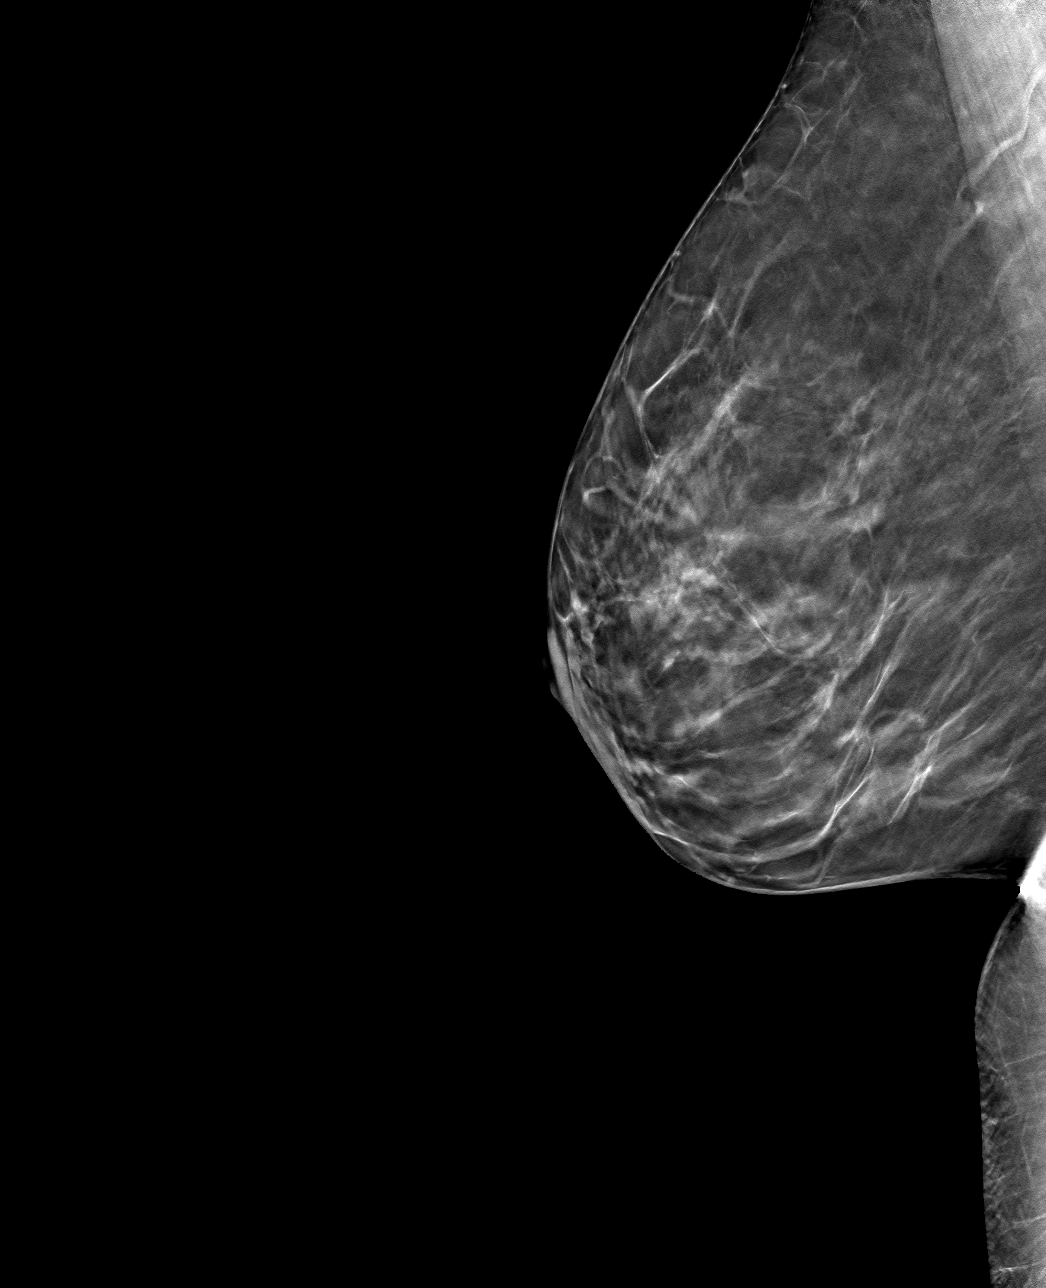

[R MLO tomo · tomo slice 27/54.0]
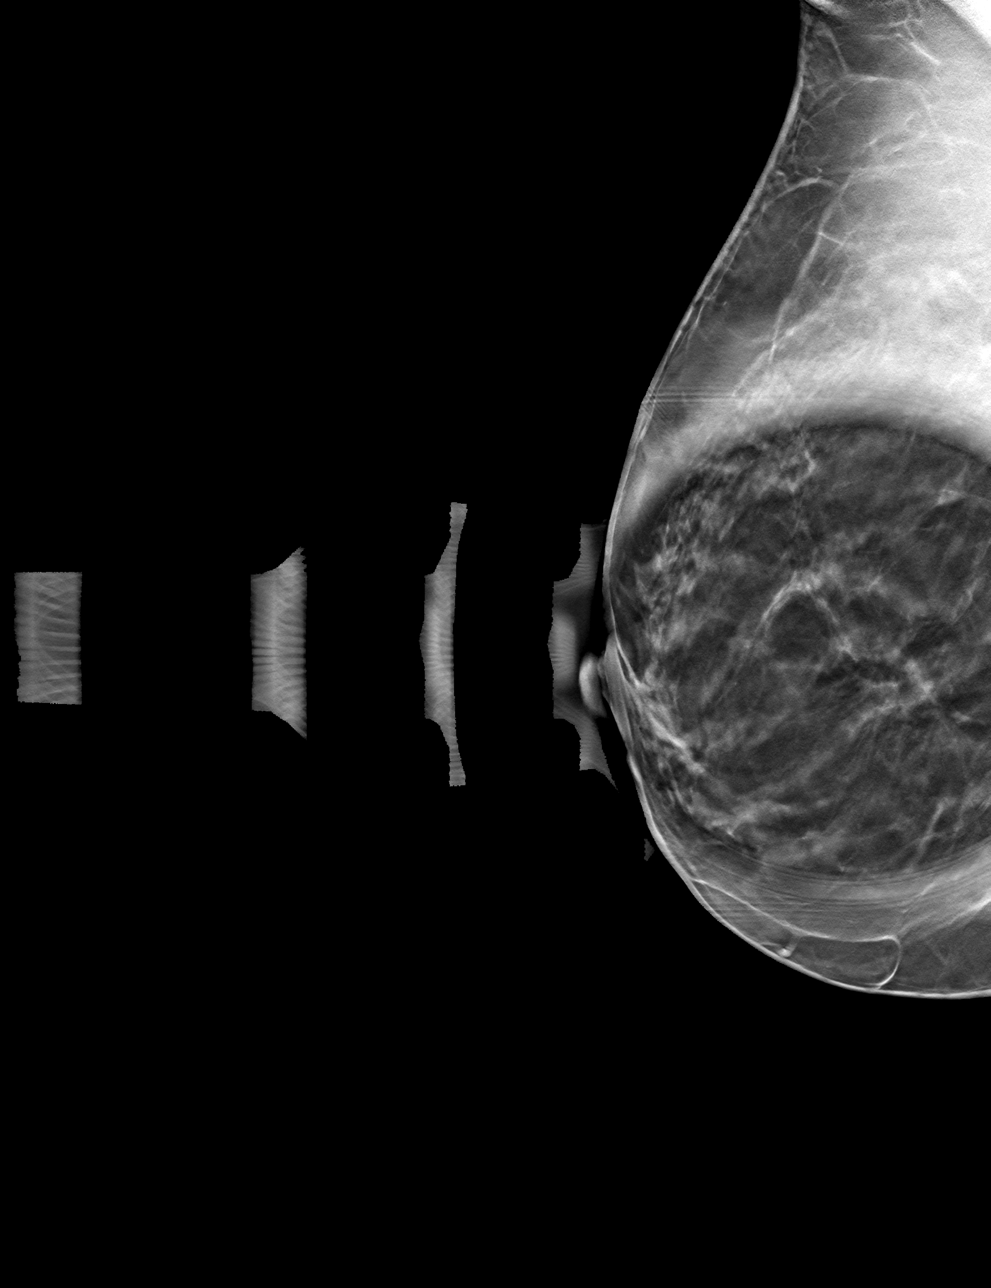

[4 of 12 positions shown; findings below may reference images not displayed]

ACR Breast Density Category c: The breast tissue is heterogeneously
dense, which may obscure small masses.
FINDINGS: Additional 2-D and 3-D images are performed. These views show no
persistent asymmetry in the retroareolar region of the RIGHT breast.
Dense fibroglandular tissue is imaged in this region.

On physical exam, I palpate no abnormality in the UPPER retroareolar
region of the RIGHT breast.

Targeted ultrasound is performed, showing normal appearing
fibroglandular tissue in the immediate retroareolar region of the
RIGHT breast. No suspicious mass, distortion, or acoustic shadowing
is demonstrated with ultrasound.
IMPRESSION: No mammographic or ultrasound evidence for malignancy.

RECOMMENDATION:
Screening mammogram in one year.(Code:ZF-[id number])

I have discussed the findings and recommendations with the patient.
If applicable, a reminder letter will be sent to the patient
regarding the next appointment.

BI-RADS CATEGORY  1: Negative.

## 2023-12-08 ENCOUNTER — Other Ambulatory Visit: Payer: Self-pay | Admitting: Physician Assistant

## 2023-12-08 DIAGNOSIS — Z1231 Encounter for screening mammogram for malignant neoplasm of breast: Secondary | ICD-10-CM

## 2023-12-19 ENCOUNTER — Encounter: Payer: Self-pay | Admitting: Hematology and Oncology

## 2023-12-19 ENCOUNTER — Ambulatory Visit
Admission: RE | Admit: 2023-12-19 | Discharge: 2023-12-19 | Disposition: A | Discharge status: Home / Self Care | Source: Ambulatory Visit | Attending: Physician Assistant | Admitting: Physician Assistant

## 2023-12-19 DIAGNOSIS — Z1231 Encounter for screening mammogram for malignant neoplasm of breast: Secondary | ICD-10-CM
